# Patient Record
Sex: Male | Born: 1945 | Race: White | Hispanic: No | Marital: Married | Smoking: Former smoker
Health system: Southern US, Community
[De-identification: ages and names within clinical notes are randomized; demographics above are authoritative.]

## PROBLEM LIST (undated history)

## (undated) DIAGNOSIS — E039 Hypothyroidism, unspecified: Secondary | ICD-10-CM

## (undated) DIAGNOSIS — I480 Paroxysmal atrial fibrillation: Principal | ICD-10-CM

## (undated) DIAGNOSIS — Z951 Presence of aortocoronary bypass graft: Secondary | ICD-10-CM

## (undated) DIAGNOSIS — Z7901 Long term (current) use of anticoagulants: Secondary | ICD-10-CM

## (undated) DIAGNOSIS — I739 Peripheral vascular disease, unspecified: Secondary | ICD-10-CM

## (undated) DIAGNOSIS — I10 Essential (primary) hypertension: Secondary | ICD-10-CM

## (undated) DIAGNOSIS — I2510 Atherosclerotic heart disease of native coronary artery without angina pectoris: Secondary | ICD-10-CM

## (undated) DIAGNOSIS — E669 Obesity, unspecified: Secondary | ICD-10-CM

## (undated) DIAGNOSIS — I472 Ventricular tachycardia: Secondary | ICD-10-CM

## (undated) DIAGNOSIS — Z794 Long term (current) use of insulin: Secondary | ICD-10-CM

## (undated) DIAGNOSIS — G4730 Sleep apnea, unspecified: Secondary | ICD-10-CM

## (undated) DIAGNOSIS — E785 Hyperlipidemia, unspecified: Secondary | ICD-10-CM

## (undated) DIAGNOSIS — IMO0001 Reserved for inherently not codable concepts without codable children: Secondary | ICD-10-CM

## (undated) DIAGNOSIS — E119 Type 2 diabetes mellitus without complications: Secondary | ICD-10-CM

## (undated) HISTORY — DX: Peripheral vascular disease, unspecified: I73.9

## (undated) HISTORY — DX: Atherosclerotic heart disease of native coronary artery without angina pectoris: I25.10

## (undated) HISTORY — DX: Obesity, unspecified: E66.9

## (undated) HISTORY — DX: Sleep apnea, unspecified: G47.30

## (undated) HISTORY — DX: Hypothyroidism, unspecified: E03.9

## (undated) HISTORY — DX: Ventricular tachycardia: I47.2

## (undated) HISTORY — DX: Long term (current) use of anticoagulants: Z79.01

## (undated) HISTORY — DX: Paroxysmal atrial fibrillation: I48.0

## (undated) HISTORY — DX: Hyperlipidemia, unspecified: E78.5

## (undated) HISTORY — DX: Essential (primary) hypertension: I10

## (undated) HISTORY — DX: Long term (current) use of insulin: Z79.4

## (undated) HISTORY — DX: Presence of aortocoronary bypass graft: Z95.1

## (undated) HISTORY — DX: Type 2 diabetes mellitus without complications: E11.9

## (undated) HISTORY — DX: Reserved for inherently not codable concepts without codable children: IMO0001

---

## 1999-05-23 ENCOUNTER — Encounter: Admission: RE | Admit: 1999-05-23 | Discharge: 1999-08-21 | Payer: Self-pay | Admitting: Internal Medicine

## 2003-12-01 ENCOUNTER — Encounter: Admission: RE | Admit: 2003-12-01 | Discharge: 2003-12-01 | Payer: Self-pay | Admitting: Allergy and Immunology

## 2003-12-26 ENCOUNTER — Encounter: Admission: RE | Admit: 2003-12-26 | Discharge: 2003-12-26 | Payer: Self-pay | Admitting: Allergy and Immunology

## 2004-01-10 ENCOUNTER — Ambulatory Visit: Payer: Self-pay | Admitting: Oncology

## 2004-02-05 DIAGNOSIS — Z951 Presence of aortocoronary bypass graft: Secondary | ICD-10-CM

## 2004-02-05 DIAGNOSIS — I2510 Atherosclerotic heart disease of native coronary artery without angina pectoris: Secondary | ICD-10-CM

## 2004-02-05 HISTORY — DX: Presence of aortocoronary bypass graft: Z95.1

## 2004-02-05 HISTORY — DX: Atherosclerotic heart disease of native coronary artery without angina pectoris: I25.10

## 2004-02-14 ENCOUNTER — Ambulatory Visit: Payer: Self-pay

## 2004-03-05 ENCOUNTER — Ambulatory Visit (HOSPITAL_COMMUNITY): Admission: RE | Admit: 2004-03-05 | Discharge: 2004-03-05 | Payer: Self-pay | Admitting: Cardiovascular Disease

## 2004-03-05 HISTORY — PX: CARDIAC CATHETERIZATION: SHX172

## 2004-03-14 ENCOUNTER — Encounter (INDEPENDENT_AMBULATORY_CARE_PROVIDER_SITE_OTHER): Payer: Self-pay | Admitting: *Deleted

## 2004-03-14 ENCOUNTER — Inpatient Hospital Stay (HOSPITAL_COMMUNITY)
Admission: RE | Admit: 2004-03-14 | Discharge: 2004-03-19 | Payer: Self-pay | Admitting: Thoracic Surgery (Cardiothoracic Vascular Surgery)

## 2004-03-14 HISTORY — PX: CORONARY ARTERY BYPASS GRAFT: SHX141

## 2004-05-01 ENCOUNTER — Encounter
Admission: RE | Admit: 2004-05-01 | Discharge: 2004-05-01 | Payer: Self-pay | Admitting: Thoracic Surgery (Cardiothoracic Vascular Surgery)

## 2004-05-15 ENCOUNTER — Encounter
Admission: RE | Admit: 2004-05-15 | Discharge: 2004-05-15 | Payer: Self-pay | Admitting: Thoracic Surgery (Cardiothoracic Vascular Surgery)

## 2004-05-16 ENCOUNTER — Ambulatory Visit: Payer: Self-pay | Admitting: Oncology

## 2004-06-04 ENCOUNTER — Ambulatory Visit (HOSPITAL_COMMUNITY): Admission: RE | Admit: 2004-06-04 | Discharge: 2004-06-05 | Payer: Self-pay | Admitting: Cardiovascular Disease

## 2004-06-04 DIAGNOSIS — I739 Peripheral vascular disease, unspecified: Secondary | ICD-10-CM

## 2004-06-04 HISTORY — DX: Peripheral vascular disease, unspecified: I73.9

## 2004-06-04 HISTORY — PX: RENAL ARTERY STENT: SHX2321

## 2004-09-19 ENCOUNTER — Ambulatory Visit: Payer: Self-pay | Admitting: Oncology

## 2004-09-25 ENCOUNTER — Encounter
Admission: RE | Admit: 2004-09-25 | Discharge: 2004-09-25 | Payer: Self-pay | Admitting: Thoracic Surgery (Cardiothoracic Vascular Surgery)

## 2005-01-18 ENCOUNTER — Ambulatory Visit (HOSPITAL_COMMUNITY): Admission: RE | Admit: 2005-01-18 | Discharge: 2005-01-18 | Payer: Self-pay | Admitting: Urology

## 2005-02-05 ENCOUNTER — Encounter
Admission: RE | Admit: 2005-02-05 | Discharge: 2005-02-05 | Payer: Self-pay | Admitting: Thoracic Surgery (Cardiothoracic Vascular Surgery)

## 2005-02-25 ENCOUNTER — Encounter (INDEPENDENT_AMBULATORY_CARE_PROVIDER_SITE_OTHER): Payer: Self-pay | Admitting: *Deleted

## 2005-02-25 ENCOUNTER — Inpatient Hospital Stay (HOSPITAL_COMMUNITY)
Admission: RE | Admit: 2005-02-25 | Discharge: 2005-02-27 | Payer: Self-pay | Admitting: Thoracic Surgery (Cardiothoracic Vascular Surgery)

## 2005-03-21 ENCOUNTER — Encounter
Admission: RE | Admit: 2005-03-21 | Discharge: 2005-03-21 | Payer: Self-pay | Admitting: Thoracic Surgery (Cardiothoracic Vascular Surgery)

## 2005-11-19 ENCOUNTER — Encounter: Admission: RE | Admit: 2005-11-19 | Discharge: 2005-11-19 | Payer: Self-pay | Admitting: Cardiology

## 2006-12-11 IMAGING — CR DG CHEST 2V SAME DAY
2 series · 2 of 2 positions shown · non-contrast
Comparison: 2 view chest x-ray earlier today 5175 hours.

CLINICAL DATA: Left thoracentesis.

CHEST - 2 VIEW  [DATE]/6993 6226 hours:

[view not recorded (1 of 2)]
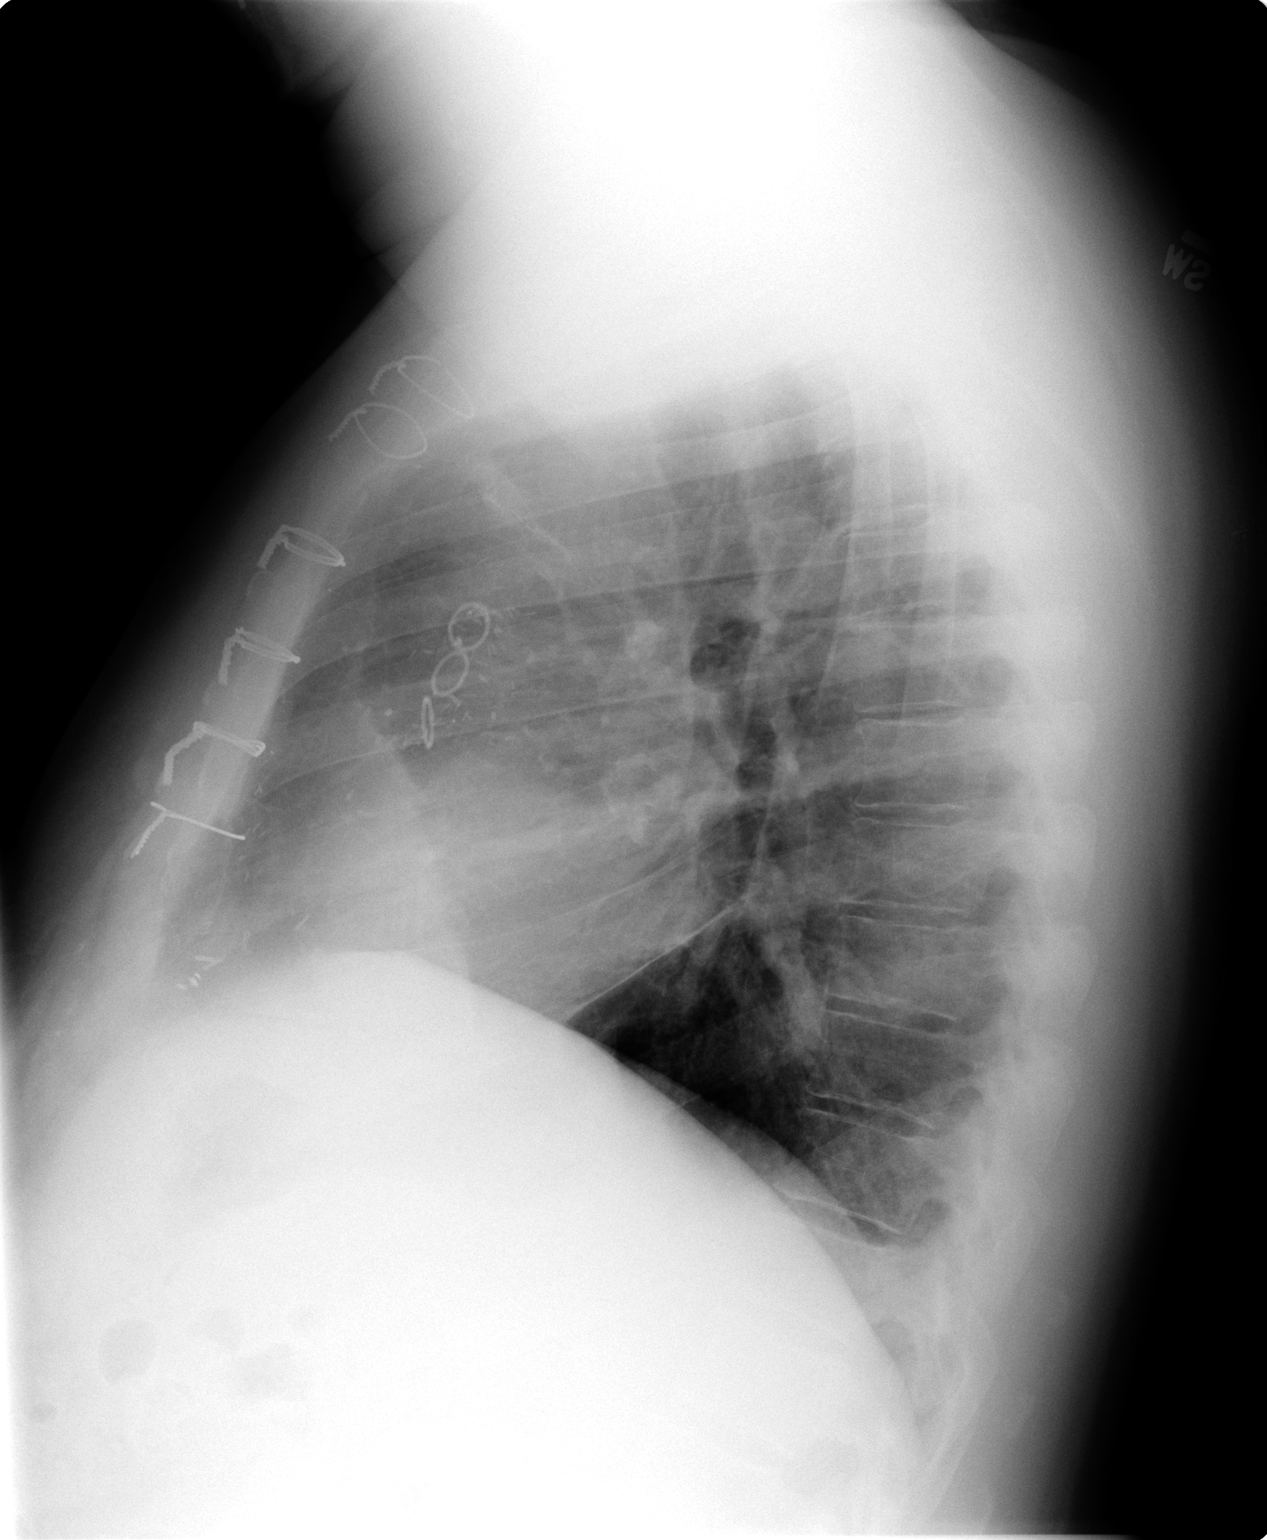

[view not recorded (2 of 2)]
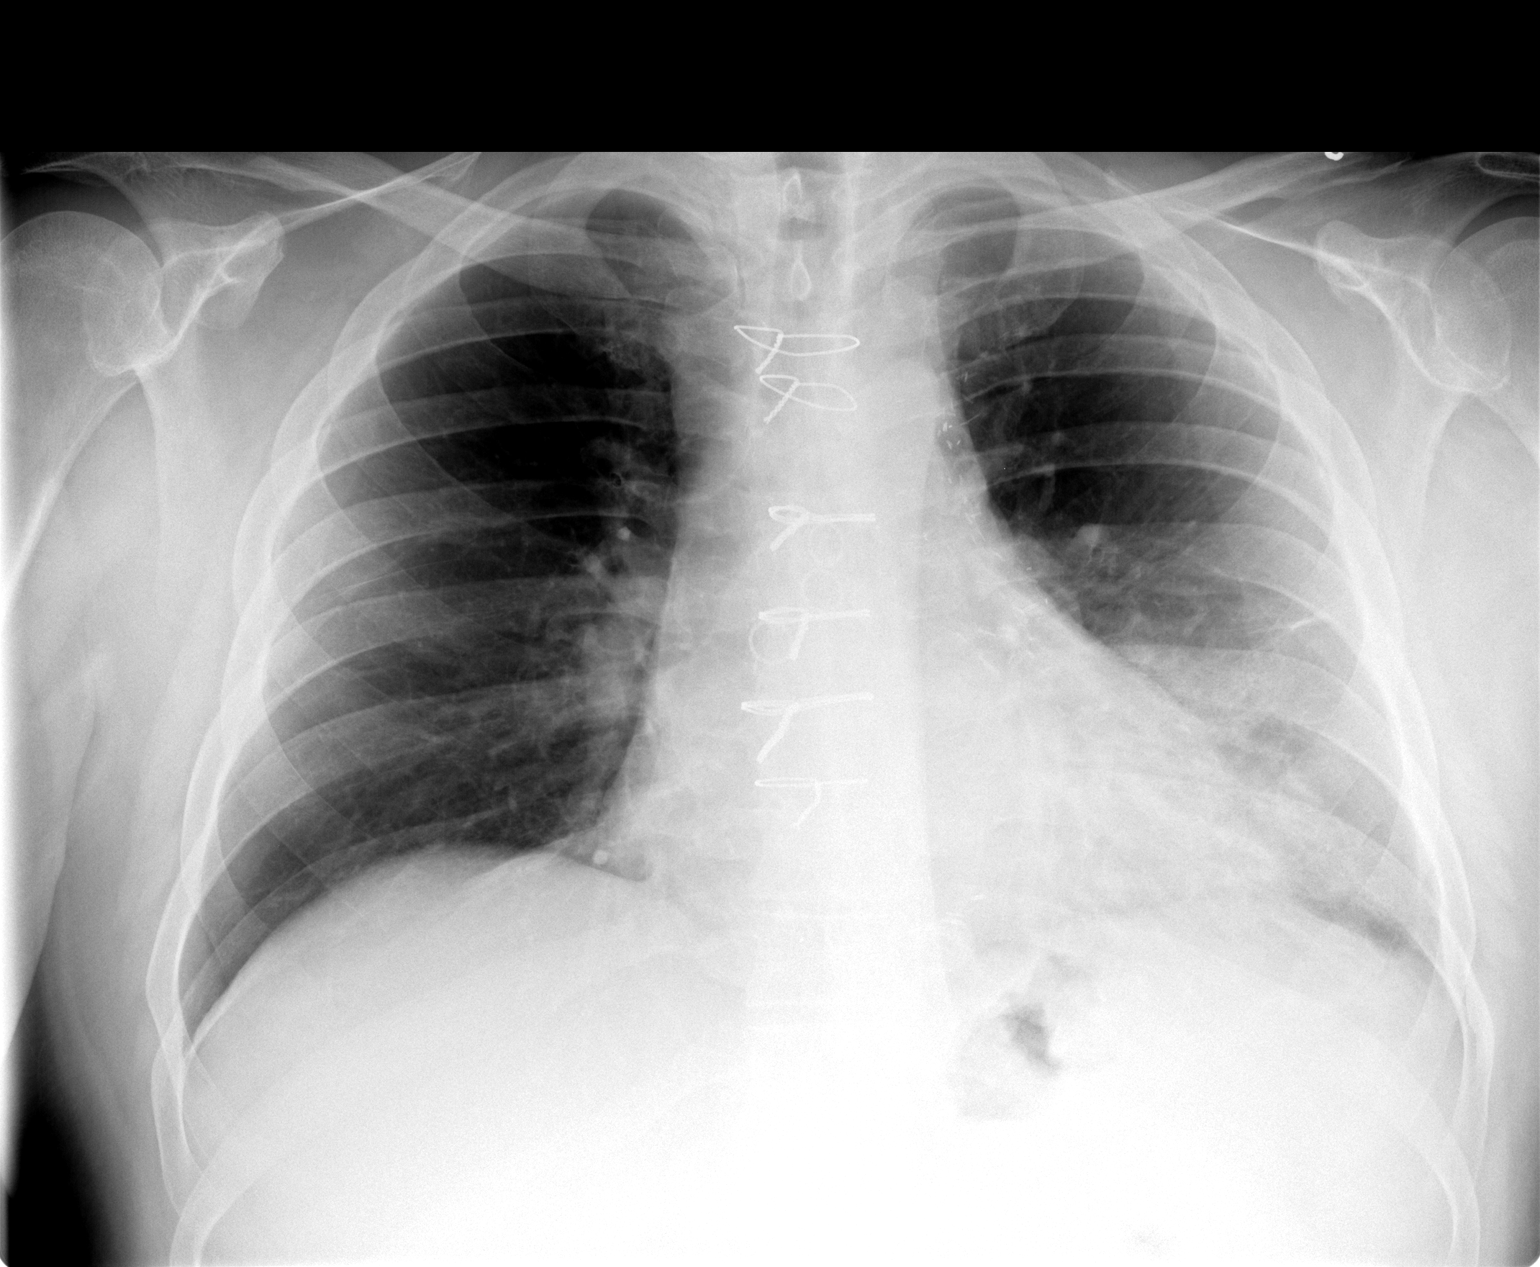

[2 of 2 positions shown; findings below may reference images not displayed]

FINDINGS: In general, patient has undergone left thoracentesis. The left
pleural effusion has resolved. There is persistent atelectasis in the left lower
lobe, though aeration is much improved. There is no pneumothorax. The right lung
remains clear.
IMPRESSION: No pneumothorax after left thoracentesis. Complete resolution of left pleural
effusion. Improved aeration in the left lower lobe with persistent mild to
moderate atelectasis.

## 2006-12-11 IMAGING — CR DG CHEST 2V
2 series · 2 of 2 positions shown · non-contrast
Comparison: 2 view chest x-ray 03/18/2004.

CLINICAL DATA: CABG in March 2004. Routine followup.

CHEST - 2 VIEW  05/01/2004:

[view not recorded (1 of 2)]
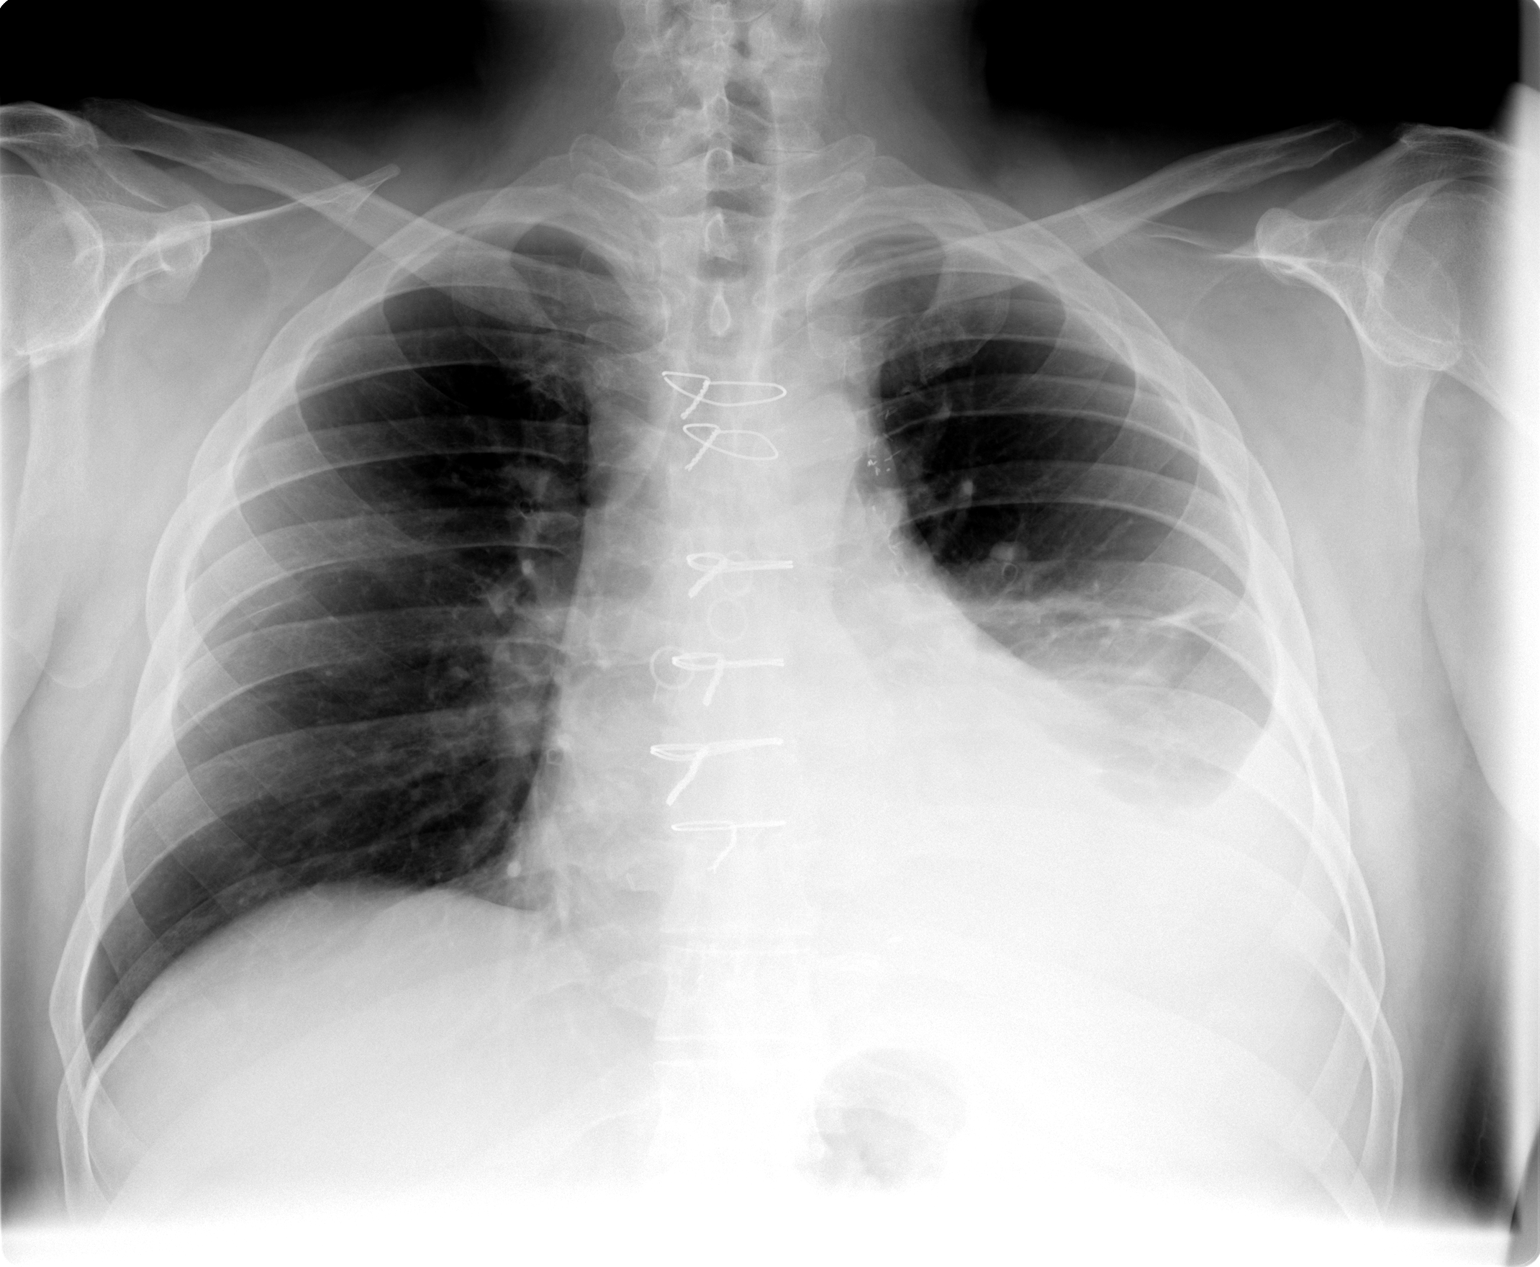

[view not recorded (2 of 2)]
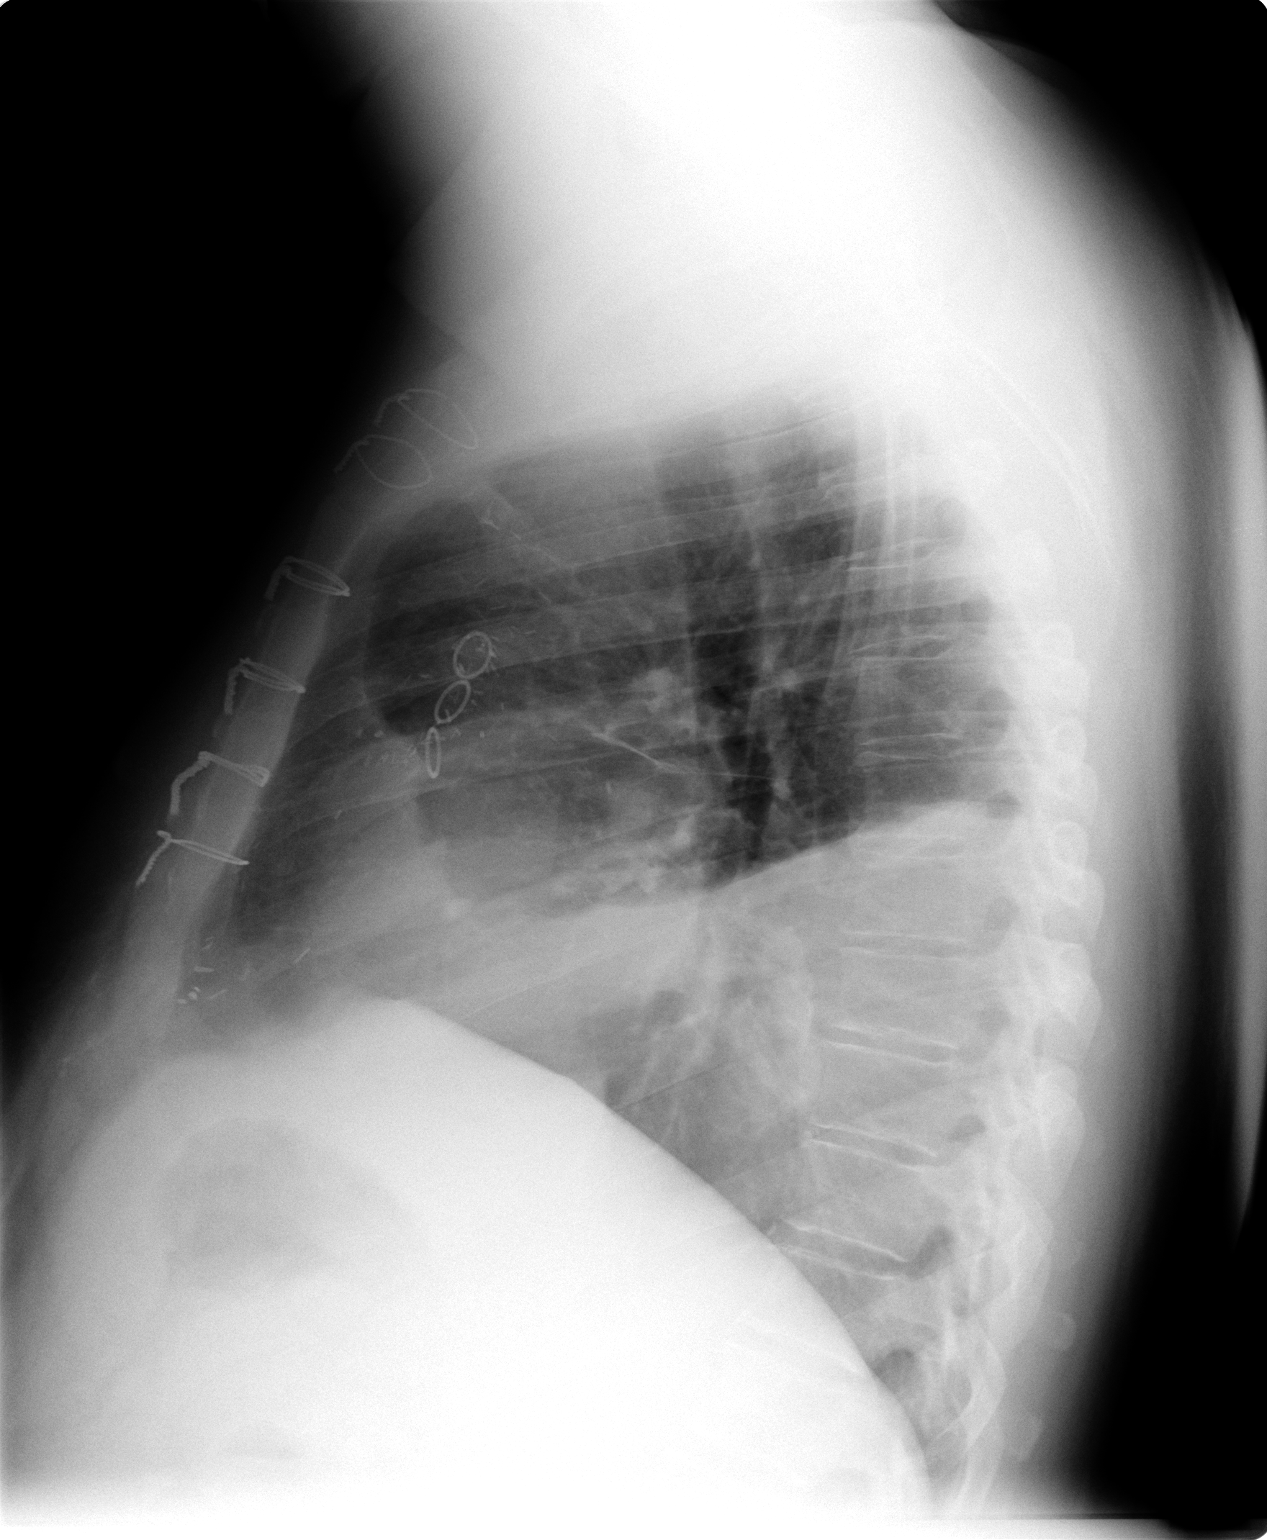

[2 of 2 positions shown; findings below may reference images not displayed]

FINDINGS: Since that time, the patient has developed a large left pleural
effusion with associated dense passive atelectasis in the left lower lobe. The
lungs appear clear otherwise. There is no evidence of pulmonary edema. The heart
is enlarged but stable. Degenerative changes in the midthoracic spine are again
noted. Patient has undergone previous sternotomy for CABG.
IMPRESSION: Development of a large left pleural effusion with associated dense passive
atelectasis in the left lower lobe since 03/18/2004.

## 2009-05-25 ENCOUNTER — Encounter
Admission: RE | Admit: 2009-05-25 | Discharge: 2009-05-25 | Payer: Self-pay | Admitting: Thoracic Surgery (Cardiothoracic Vascular Surgery)

## 2009-05-25 ENCOUNTER — Ambulatory Visit: Payer: Self-pay | Admitting: Thoracic Surgery (Cardiothoracic Vascular Surgery)

## 2009-10-28 ENCOUNTER — Ambulatory Visit (HOSPITAL_BASED_OUTPATIENT_CLINIC_OR_DEPARTMENT_OTHER): Admission: RE | Admit: 2009-10-28 | Discharge: 2009-10-28 | Payer: Self-pay | Admitting: Cardiovascular Disease

## 2009-10-28 ENCOUNTER — Ambulatory Visit: Payer: Self-pay | Admitting: Diagnostic Radiology

## 2010-02-25 ENCOUNTER — Encounter: Payer: Self-pay | Admitting: Cardiovascular Disease

## 2010-06-19 NOTE — Assessment & Plan Note (Signed)
OFFICE VISIT   John Conley, John Conley  DOB:  1945-05-06                                        May 25, 2009  CHART #:  [id number]   REASON FOR OFFICE VISIT:  Sternal pain and clicking.   HISTORY OF PRESENT ILLNESS:  The patient is a 65 year old gentleman who  is well known to me.  He had coronary bypass grafting x5 in February  2006.  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  In January 2007, he underwent  a sternal debridement and rewiring.  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  There was a  single transverse fracture on the left.  A single wire was passed around  that, and then the manubrium was closed with single wires and the body  of the sternum was closed with sternal bands.  He was last seen about a  month after that, at which time he was doing much better.  He was having  minimal discomfort and that continued he says until about 2 months ago  when he noticed when he would bend over in certain fashion or if you  would lift a heavy object that he had pain in his chest.  He said this  did not feel like angina.  It was a sharp pain localized.  It was very  transient.  He really does not even have to take an aspirin for it.  He  also felt some clicking and some movement in the lower part of the  sternum but not the upper part of the sternum.  He now comes in for  evaluation.   CURRENT MEDICATIONS:  1. Actos 45 mg q.a.m.  2. Aspirin 162 mg q.p.m.  3. Coenzyme Q10.  4. Furosemide 40 mg daily.  5. Glimepiride 1 mg daily.  6. Lipitor 40 mg daily.  7. Losartan 100 mg daily.  8. Metformin 850 mg t.i.d.  9. Potassium 20 mg daily.  10.Flomax 0.4 mg at bedtime.  11.TriCor 145 mg daily.  12.He has sublingual nitroglycerin but has not had to take him.   ALLERGIES:  He has no known drug allergies.   PAST MEDICAL HISTORY:  Significant for coronary artery disease status  post coronary bypass grafting, sternal  dehiscence and sternal rewiring,  type 2 diabetes, hypercholesterolemia, hypertension, sleep apnea,  previous shoulder surgery, knee surgery, previous penile implant.   REVIEW OF SYSTEMS:  He has been feeling well other than the current  complaints, very active, no anginal-type symptoms, no heart failure  symptoms.  All other systems are negative.   PHYSICAL EXAMINATION:  The patient is an overweight but otherwise well-  appearing white male in no acute distress.  Blood pressure is 129/66,  pulse 70, respirations are 18, oxygen saturation is 98% on room air.  His sternal incision is clean, dry, and intact.  The skin is well  healed.  There are no signs of infection.  The upper sternum is stable.  The lower sternum on the left side on the lower third there is palpable  clicking and popping around the costal cartilages and some mild  tenderness to palpation.   DIAGNOSTIC TESTS:  Chest x-ray is reviewed and shows a change in  position of the sternal bands relative to the transverse wire.  It is  really difficult to tell on the plain films that the fracture of the  bands which may well represent or whether there is a very subtle  fracture of the wire, this allowed the bands to pull across.   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  Wound intact with no  signs of infection.   I had a discussion with the patient regarding our options which are  strictly based on symptoms.  There is no medical necessity to repair the  sternum per say.  Right now, his symptoms are minimal.  I did discuss  with him our options which include rewiring.  I do not think that would  be advisable in this situation given that he has failed that twice.  Potentially, we could plate the lower portion of his sternum or some  combination of plates and wires might be effective, but he is always  going to have some significant risk of failure  given that he has failed  twice on the lower portion of the sternum.  He at the present time does  not want to consider surgery.  He is very comfortable that he is not in  medical danger and we will try and balance his activities.  Certainly if he were to begin to have more discomfort or pain, we could  at anytime decide to go ahead and work on the sternum again.   Salvatore Decent Dorris Fetch, M.D.  Electronically Signed   SCH/MEDQ  D:  05/25/2009  T:  05/26/2009  Job:  [id number]   cc:   Macarthur Critchley. Shelva Majestic, M.D.  Nanetta Batty, M.D.  Sid Falcon, MD

## 2010-06-22 NOTE — Discharge Summary (Signed)
John Conley, John Conley          ACCOUNT NO.:  [id number]   MEDICAL RECORD NO.:  [id number]          PATIENT TYPE:  INP   LOCATION:  2018                         FACILITY:  [facility]   PHYSICIAN:  Salvatore Decent. Dorris Fetch, M.D.DATE OF BIRTH:  12/27/1945   DATE OF ADMISSION:  03/14/2004  DATE OF DISCHARGE:  03/18/2004                                 DISCHARGE SUMMARY   ADMISSION DIAGNOSIS:  Three vessel coronary artery disease.   DISCHARGE DIAGNOSES:  1.  Hypertension.  2.  Hyperlipidemia.  3.  Type 2 noninsulin-dependent diabetes mellitus.  4.  Sleep apnea which for he uses a CPAP.  5.  Three vessel coronary artery disease, status post coronary artery bypass      grafting x 5 with endarterectomy.  6.  Postoperative anemia, stable.  7.  Postoperative creatinine elevation, stable.   ALLERGIES:  No known drug allergies.   BRIEF HISTORY:  The patient is a 65 year old Caucasian male with a  longstanding history of type 2 diabetes mellitus, hypertension, and  hyperlipidemia. For approximately three to four months prior to admission,  the patient noticed pain in his neck on the right side with exercise. This  happened several times when he was excited; however, he notices no symptoms  at rest. He then saw Dr. Macarthur Critchley. Torelli in [facility] where he had an  exercise echocardiogram performed. This did not show any evidence of  ischemia; however, he only achieved 73% of his predicted heart rate. He was  treated medically; however, he continued to have symptoms and was  subsequently sent to have a Persantine-Cardiolite study done which showed  left lateral wall ischemia. The patient was then referred to Dr. Nanetta Batty for cardiac catheterization. This revealed severe three vessel  coronary artery disease with preserved left ventricular function. He was  then sent for consultation regarding surgical revascularization by Dr.  Viviann Spare C. Hendrickson.   Dr. Salvatore Decent. Dorris Fetch evaluated  the patient on March 07, 2004 at the  CVTS office and it was his opinion that the patient should undergo coronary  artery bypass grafting.   HOSPITAL COURSE:  The patient was admitted and taken to the OR on March 14, 2004 for coronary artery bypass graft surgery x 5. An endarterectomy was  also performed of the first diagonal. The left internal mammary artery was  grafted to the LAD, saphenous vein was grafted to the distal left anterior  descending artery, saphenous vein was grafted to the first diagonal,  saphenous vein was grafted to the posterior descending and left radial  artery was grafted to the ramus intermediate. Endoscopic vessel harvesting  was performed on the left leg and the radial artery was harvested in an open  fashion. The patient tolerated the procedure well and was hemodynamically  stable immediately postoperatively. The patient was extubated without  complication and awoke up from anesthesia neurologically intact.  The  patient was transferred from the OR to the SICU in stable condition.   On postoperative day one, the patient was afebrile with stable vital signs  and he was maintaining normal sinus rhythm. Physical exam was unremarkable  and the patient was doing well. Invasive lines and chest tubes were  discontinued in a routine manner and he tolerated this well. The patient was  restarted on his Amaryl and Actos at that time. The patient has been quite  volume overloaded postoperatively and has been diuresed accordingly. He will  need to continue diuresis after discharge.   Dr. Veverly Fells. Altheimer has been following the patient postoperatively for  his diabetes mellitus. He has been maintained on his Actos and Amaryl as  well as sliding scale Novolog insulin. The patient's Metformin was not  restarted secondary to an elevated creatinine. His creatinine level was 1.7  on postoperative day three and four. This has remained stable and he will  therefore not  be restarted on his Metformin at this time; however, it should  possibly be started as an outpatient.   The patient began cardiac rehabilitation on postoperative day two and has  increased his tolerance and has is doing quite well with that at this time.   The remainder of the patient's postoperative course has progressed as  expected with the exception of a mild postoperative anemia which was noted  on postoperative day four. His hemoglobin and hematocrit are 8.8 and 25.1 at  this time. A repeat CBC will be performed prior to the patient's discharge.   On postoperative day four, the patient is without complaint. He is afebrile  with stable vital signs and he is maintaining normal sinus rhythm. On  physical exam, cardiac is regular rate and rhythm. Lung exam reveals  decreased breath sounds in the bilateral bases and faint crackles in the  bilateral bases. The abdomen is benign. The sternal and bilateral lower  extremity incisions are clean, dry, and intact and there is a small amount  of serous drainage present at the distal portion of the incision on the left  upper extremity. There is 1+ pitting edema present in the bilateral lower  extremities as well as left upper extremity. Neurologic and motor exams are  normal on the left hand.   The patient is in stable condition at this time. He will need to be  continued on his diuretic after discharge for his volume overload as he is  still approximately 20 pounds above his preoperative weight. His creatinine  is stable at 1.7 at this time and therefore his Metformin will continue to  be held. Considering the fact the patient will continue on a diuretic, this  will need to be monitored closely as an outpatient. 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 The patient's ACE inhibitor has therefore been held secondary to the renal artery  stenosis and  his elevated creatinine.   As long as the patient continues to progress in the current manner, he  should be stable for discharge within the next one to two days.   LABORATORY DATA:  CBC and BNP on March 18, 2004 showed white count of  7.5, hemoglobin 8.8, hematocrit 25.1, and platelets 186,000. Sodium 136,  potassium 4.6, BUN 46, creatinine 1.7, glucose 96.   CONDITION ON DISCHARGE:  Improved.   DISCHARGE MEDICATIONS:  1.  Aspirin 325 mg q.d.  2.  Toprol XL 25 mg q.d.  3.  Lipitor 40 mg q.d.  4.  Tri-Chlor 145 mg q.d.  5.  Actos 45 mg q.d.  6.  Amaryl 2 mg b.i.d.  7.  Diltiazem 240 mg q.d.  8.  Imdur 30 mg q.d. x 6 weeks.  9.  Plavix 75 mg q.d.  10. Lasix 40 mg q.d.  11. K-Dur 20 mEq q.d.  12. Tylox 1-2 p.o. q.4-6h. p.r.n. pain.   ACTIVITY:  No driving and no lifting more than 10 pounds. The patient should  continue daily breathing and walking exercises.   DIET:  Low salt, low fat, carbohydrate modified medium calorie.   WOUND CARE:  The patient may shower daily and clean the incisions with soap  and water. If any problems arise, the patient should contact the CVTS  office.   FOLLOW UP:  Follow-up appointment with Dr. Macarthur Critchley. Torelli. The patient  should call his office for an appointment two weeks after discharge at [phone]. The patient should have a chest x-ray taken at the time of that  appointment. Dr. Salvatore Decent. Hendrickson in three weeks after discharge. CVTS  office will contact the patient with the date and time of that appointment.  The patient has been instructed to bring the chest x-ray from the  appointment with Dr. Macarthur Critchley. Torelli to the appointment with Dr. Viviann Spare C.  Hendrickson. The patient should also follow up with his primary care  physician regarding his diabetes mellitus.      AY/MEDQ  D:  03/18/2004  T:  03/18/2004  Job:  [id number]   cc:   Salvatore Decent. Dorris Fetch, M.D.  [address]  [place]  [place] [address]   Macarthur Critchley.  Shelva Majestic, M.D.  [address] [place]  [address]  [place]  [place] [address]  Fax: [phone]   Patient's chart

## 2010-06-22 NOTE — Op Note (Signed)
John Conley, John Conley          ACCOUNT NO.:  [id number]   MEDICAL RECORD NO.:  [id number]          PATIENT TYPE:  INP   LOCATION:  2007                         FACILITY:  [facility]   PHYSICIAN:  Salvatore Decent. Dorris Fetch, M.D.DATE OF BIRTH:  1945-02-05   DATE OF PROCEDURE:  02/25/2005  DATE OF DISCHARGE:                                 OPERATIVE REPORT   PREOPERATIVE DIAGNOSIS:  Sternal nonunion.   POSTOPERATIVE DIAGNOSIS:  Sternal nonunion.   PROCEDURE:  Sternal debridement and rewiring.   SURGEON:  Hendrickson.   ANESTHESIA:  General.   FINDINGS:  Sternal nonunion with pseudo joint formation, no significant  osteoporosis, single transverse fracture in left hemi sternum.   CLINICAL NOTE:  John Conley is a 65 year old gentleman who underwent  coronary bypass grafting on March 14, 2004.  He has done well from a  cardiac standpoint, but developed chronic sternal pain and motion in the  sternum during the recuperative period.  He now presents with progressively  increasing discomfort and limitation and interference with his quality of  life. The patient is offered sternal debridement and rewiring.  He  understood the risks which include repeat sternal nonunion, infection,  bleeding, DVT, PE, injury to heart or other intrathoracic structures, MI or  death. He understood and accepted these risks and agreed to proceed.   OPERATIVE NOTE:  John Conley was brought to the preop holding area on  February 25, 2005.  Lines were placed by the anesthesia service for  intravenous access. Intravenous antibiotics were administered. He was taken  to the operating room, anesthetized and intubated.  The chest and abdomen  were prepped and draped in usual fashion. Incision was made through the  patient's prior midline sternotomy incision, excising the scar. The  subcutaneous tissue was divided with electrocautery exposing the sternal  wires. The preexisting sternal wires were removed without  difficulty. There  was a clear sternal nonunion with a pseudo joint formation. This fibrous  tissue was incised along the anterior table initially and the posterior  aspect of the fibrous capsule was divided with scissors.  Each hemi sternum  then was gently elevated with bone hooks to allow debridement of the fibrous  tissue.  On both sides, the tissue was debrided back to vascularized bone.  Rongeurs were used to debride the cortical bone an curette was used to  debride the marrow.  The right pleural space was entered while working on  the posterior aspect of the right hemi sternum. There was no evidence of  infection. After completely debriding the sternum back to healthy viable  bone tissue, the transverse fracture was repaired on the left side with two  encircling sternal wires and a single wire was passed twice around the  interspaces above and below to bring this portion of the sternum back  together.  The manubrial portion of sternum then was closed with heavy gauge  double stainless steel wires and sternal bands then were placed around the  body of the sternum through the interspaces.  A total of two double wires  and four sternal bands were used.  The sternum came back together  easily in  the sternal halves were well-approximated. The wound was copiously irrigated  with warm antibiotic saline.  The fascial layer was closed with a running #1  Vicryl suture.  The subcutaneous tissue was closed with running 2-0 Vicryl suture and the  skin was closed with 3-0 Vicryl subcuticular suture. All sponge, needle and  instrument counts were correct at the end of the procedure. The patient was  taken from the operating room to the postanesthetic care unit extubated and  in stable condition.           ______________________________  Salvatore Decent Dorris Fetch, M.D.     SCH/MEDQ  D:  02/26/2005  T:  02/27/2005  Job:  [id number]   cc:   Macarthur Critchley. Shelva Majestic, M.D.  Fax: [phone]

## 2010-06-22 NOTE — Discharge Summary (Signed)
John Conley, John Conley          ACCOUNT NO.:  [id number]   MEDICAL RECORD NO.:  [id number]          PATIENT TYPE:  INP   LOCATION:  2007                         FACILITY:  [facility]   PHYSICIAN:  Salvatore Decent. Dorris Fetch, M.D.DATE OF BIRTH:  Aug 08, 1945   DATE OF ADMISSION:  02/25/2005  DATE OF DISCHARGE:  02/26/2005                                 DISCHARGE SUMMARY   ADMISSION DIAGNOSIS:  Sternal dehiscence, status post coronary artery bypass  graft in February 2006.   PAST MEDICAL HISTORY AND DISCHARGE DIAGNOSES:  1.  Coronary artery disease.  2.  Status post coronary artery bypass graft in 2006.  3.  Diabetes mellitus, type 2.  4.  Hypercholesterolemia.  5.  Hypertension.  6.  History of sleep apnea.  7.  Status post shoulder surgery.  8.  Status post knee surgery.  9.  Status post penile implant in December 2006.  10. Sternal dehiscence, status post sternal re-wiring with debridement.   ALLERGIES:  No known drug allergies.   BRIEF HISTORY:  The patient is a 65 year old Caucasian male who is status  post coronary artery bypass grafting times five in February 2006 by Dr.  Dorris Fetch. The patient did well initially postoperatively and was  discharged home on postoperative day four. He later developed sternal click  and evidence of dehiscence. However, he remained stable for quite some time  with limited symptoms related to this. More recently the patient developed  increasing discomfort associated with this and his symptoms were primarily  noted with coughing , standing and any type of lifting. He was recently re-  evaluated by Dr. Dorris Fetch who planned to proceed with sternal re-wiring.   HOSPITAL COURSE:  The patient was admitted and taken to the OR on February 25, 2005, for sternal debridement and re-wiring. The patient tolerated the  procedure well and was hemodynamically immediately postoperatively. The  patient was transferred from the OR to the postanesthesia care unit  in  stable condition. The patient was extubated without complication, woke from  anesthesia neurologically intact.   The patient's postoperative course progressed as expected. He ambulated on  the evening of his operative day and is doing quite well with this. On  postoperative day one he was without complaint. He was afebrile with stable  vital signs. The patient's CBGs are being controlled with his home oral  medications. On physical exam, the wound is clean, dry, and intact, the  lungs are clear, and his chest x-ray is stable. All invasive lines and chest  tubes have been discontinued on a routine manner and the patient tolerated  this well. As long as the patient continues to progress in the current  manner he will be ready for discharge in the next one or two days pending  morning round re-evaluation.   LABORATORY DATA:  CBC on February 21, 2005, revealed hemoglobin of 12.1,  hematocrit 34.5, white count 5.3, platelet count 195,000. BMP on January  18th revealed sodium of 134, potassium 4.4, BUN 24, creatinine 1.3, glucose  202.   CONDITION ON DISCHARGE:  Stable.   INSTRUCTIONS:  1.  Diet--ADA.  2.  Activity--no  driving for three weeks, no lifting for three weeks, and      the patient should increase his activity slowly and continue his daily      breathing and walking exercises.  3.  Wound care--the patient should clean the site daily with soap and water.      If wound problems arise the CVTS office should be contacted.   MEDICATIONS:  1.  Tylox one to two q.4-6h. p.r.n. pain.  2.  Actos 4-5 mg daily.  3.  Glyburide 2 mg q.a.m. and 4 mg q.p.m.  4.  Cartia 240 mg q.a.m.  5.  Cozaar 20 mg at midday.  6.  Metformin 850 mg b.i.d.  7.  Tricor 145 mg q.a.m.  8.  Lipitor 40 mg q.h.s.  9.  Aspirin 325 mg daily.   FOLLOWUP APPOINTMENT:  1.   Imaging for a PA and lateral chest x-ray on March 21, 2005, at 10:45 a.m.  2.  Dr. Dorris Fetch on March 21, 2005, at  11:45 a.m.      Pecola Leisure, PA    ______________________________  Salvatore Decent Dorris Fetch, M.D.    AY/MEDQ  D:  02/26/2005  T:  02/26/2005  Job:  [id number]

## 2010-06-22 NOTE — Discharge Summary (Signed)
NAMELAWERNCE, EARLL          ACCOUNT NO.:  [id number]   MEDICAL RECORD NO.:  [id number]          PATIENT TYPE:  OIB   LOCATION:  6531                         FACILITY:  [facility]   PHYSICIAN:  Lezlie Octave, N.P.     DATE OF BIRTH:  August 27, 1945   DATE OF ADMISSION:  06/04/2004  DATE OF DISCHARGE:  06/05/2004                                 DISCHARGE SUMMARY   HISTORY OF PRESENT ILLNESS:  Mr. Pap Zoe Lan is a 65 year old white married  male patient of Dr. Harland Dingwall who recently underwent coronary artery  bypass grafting March 24, 2004, at his cardiac catheterization.  It was  also noted that he had renal artery stenosis.  He returns on Jun 04, 2004,  for [facility] angio/renal angio and probable PCI.  He underwent angiogram by Dr.  Nanetta Batty on Jun 04, 2004, which revealed a 90% left renal artery  stenosis.  He underwent stenting with a 6x18 Genesis.  Postprocedure, he did  well on Jun 05, 2004.  He was seen by Dr. Nanetta Batty.  He was considered  stable for discharge home.  His labs showed his BUN was 18, creatinine was  1.2, sodium 139, potassium 3.9, chloride 101, CO2 29.  Hemoglobin 11.5,  hematocrit 32.5, platelets 196,000, WBC 5.1.  No medication changes were  made.  His monitor showed that he was in sinus rhythm.   DISCHARGE MEDICATIONS:  1.  Cartia 240 mg daily.  2.  Tri-Chlor 145 mg daily.  3.  Actos 45 mg once daily.  4.  Lipitor 20 mg once daily.  5.  Amaryl 2 mg b.i.d.  6.  Aspirin 325 mg once daily.  7.  Multivitamin once daily.  8.  Hyzaar 100/25 one half daily.   DISCHARGE INSTRUCTIONS:  He may return to cardiac rehab on Monday if his  groin is without any significant bruising or discomfort.  If he has any  problems with his groin, he has been asked to call Dr. Shelva Majestic.  He will  follow up with renal Doppler at Dr. Clayborne Dana office at [facility] on Jun 25, 2004, at 8 a.m.  Our office will send  him instructions about the  test and our location.  After that, he has a  follow up appointment with Dr. Billie Ruddy in approximately 2-3 weeks.   DISCHARGE DIAGNOSES:  1.  ASPVD with left renal artery stenosis, high grade with history of renal      artery insufficiency.  He is now status post stenting of his left renal      artery.  2.  Coronary artery disease with coronary artery bypass graft on March 24, 2004, by Dr. Dorris Fetch with LIMA to his left anterior descending.      Left radial artery to the RI, saphenous vein graft to the distal left      anterior descending, saphenous vein graft to the first diagonal and then      saphenous vein graft to the PDA.  3.  Hypertension.  4.  Non-insulin-dependent diabetes mellitus.  5.  Sleep apnea on  CPAP.  6.  Hyperlipidemia.  7.  Normal left ventricular function.  8.  History of renal insufficiency.      BB/MEDQ  D:  06/05/2004  T:  06/05/2004  Job:  [id number]   cc:   Macarthur Critchley. Shelva Majestic, M.D.  [address] [place]  [address]  [place]  [place] [address]  Fax: [phone]   Reather Littler, M.D.  [address], [address]  [place]  [place] [address]  Fax: [phone]   Salvatore Decent. Dorris Fetch, M.D.  [address]  [place]  [place] [address]

## 2010-06-22 NOTE — Op Note (Signed)
John Conley, John Conley          ACCOUNT NO.:  [id number]   MEDICAL RECORD NO.:  [id number]          PATIENT TYPE:  INP   LOCATION:  2316                         FACILITY:  [facility]   PHYSICIAN:  Salvatore Decent. Dorris Fetch, M.D.DATE OF BIRTH:  03-23-1945   DATE OF PROCEDURE:  03/14/2004  DATE OF DISCHARGE:                                 OPERATIVE REPORT   PREOPERATIVE DIAGNOSIS:  Severe three-vessel disease with exertional angina.   POSTOPERATIVE DIAGNOSIS:  Severe three-vessel disease with exertional  angina.   PROCEDURE:  Median sternotomy; extracorporeal circulation; coronary artery  bypass grafting x5 with endarterectomy (left internal mammary artery to mid  left anterior descending, saphenous vein graft to the distal left anterior  descending, saphenous vein graft to first diagonal with an endarterectomy,  saphenous vein graft to the posterior descending, left radial artery to  ramus intermedius); endoscopic vein harvest, left leg.   SURGEON:  Salvatore Decent. Dorris Fetch, MD   ASSISTANT:  Carmin Muskrat. Eustaquio Boyden.   ANESTHESIA:  General.   FINDINGS:  Diffusely diseased coronaries, mid LAD -- a fair target, distal  LAD -- poor target, diagonal -- poor target, requiring endarterectomy,  posterior descending and ramus intermedius -- good targets, good-quality  conduits, saphenous vein not localized in right leg, no vein harvested from  that side, saphenous vein from the left leg of good quality, left  ventricular hypertrophy.   CLINICAL NOTE:  John Conley is a 65 year old gentleman with multiple  cardiac risk factors.  He presents with a history of exertional discomfort.  He was evaluated first by Dr. Macarthur Critchley. Torelli and then referred to Dr.  Nanetta Batty for catheterization.  At cardiac catheterization, he had  severe three-vessel disease; he was referred for coronary artery bypass  grafting.  The indications, risks, benefits and alternatives were discussed  in detail with the  patient.  He understood that there was a significant  chance of an incomplete revascularization due to the severity of his native  disease.  John Conley understood and accepted the risks of surgery and  agreed to proceed.   OPERATIVE NOTE:  John Conley was brought to the preop holding area on  March 14, 2004.  There, the anesthesia service placed lines to monitor  arterial, central venous and pulmonary arterial pressure.  EKG leads were  placed for continuous telemetry.  Intravenous antibiotics were administered.  He was transported to the operating room, anesthetized and intubated, a  Foley catheter was placed, and the chest, abdomen, legs and left arm were  prepped and draped in usual fashion.   An incision was made over the radial pulse on the volar aspect of the left  forearm; this incision was approximately 6- to 8-cm long initially.  It was  carried through the skin and subcutaneous tissue.  Minimal electrocautery  was utilized.  The radial fascia was identified and incised.  The underlying  radial pulse was palpated.  The patient had had a normal Allen's test and  normal pulse oximetry tracing with radial occlusion preoperatively.  A short  segment of the radial artery then was immobilized; branches were clipped and  divided.  After circumferentially immobilizing the radial artery, a soft  bulldog clamp was placed across the vessel and there was a good pulse  distally.  The bulldog clamp was removed and the radial then was harvested  in standard fashion.  An incision was made from just below the antecubital  fossa in a lazy S fashion and carried down to the wrist incision.  The  harmonic scalpel was used to divide the subcutaneous tissue and then to  harvest the radial graft.  The radial was a good-quality vessel.  The  patient was heparinized prior to dividing the distal end of the radial  graft.  After dividing the distal end, there was excellent flow through the   radial.  Intraluminal papaverine was instilled and then a soft bulldog clamp  was placed across the distal end of the graft.  The distal stump was then  inspected; there was good back-bleeding; it was ligated with a 4-0 Prolene  suture.  The radial artery then was ligated proximally after ensuring that  all branches had been clipped and placed in a heparinized papaverine/saline  solution.  The proximal stump was likewise ligated with a 4-0 Prolene  suture.  The incision then was closed in 2 layers with a running 3-0 Vicryl  subcutaneous suture and a running 3-0 Vicryl subcuticular suture after  achieving meticulous hemostasis.  The arm then was wrapped and then tucked  at the patient's side.  Simultaneously, then an incision was made in the  medial aspect of the right leg at the level of the knee.  Exploration failed  to reveal a saphenous vein at that site; therefore an incision was made on  the left leg and the saphenous vein was easily identified and was a good-  quality vessel.  It was harvested from the left thigh endoscopically and a  short segment was harvested from below the  knee endoscopically as well.  This saphenous vein was of good quality.   A median sternotomy was performed and the left internal mammary artery was  harvested in standard fashion; this was difficult due to the patient's body  habitus, but the mammary artery was a good-quality vessel.  The remainder of  the full heparin dose was given prior to dividing the distal end of the left  mammary artery; there was excellent flow through the cut end of the vessel.   The pericardium was opened and the ascending aorta was inspected and  palpated; there was no palpable atherosclerotic disease.  The aorta was  cannulated via concentric 2-0 Ethibond pledgeted pursestring sutures.  A  dual-stage venous cannula was placed via pursestring suture in the right atrial appendage.  Cardiopulmonary bypass was institute and the patient  was  cooled to 32 degrees Celsius.  The coronary arteries were inspected and  anastomotic sites were chosen.  The conduits were inspected and cut to  length.  A foam pad was placed in the pericardium to protect the left  phrenic nerve.  A temperature probe was placed in the myocardial septum and  a cardioplegia cannula was placed in the ascending aorta.   The aorta was cross-clamped.  The left ventricle was emptied via the aortic  root vent and then cardiac arrest then was achieved with a combination cold  antegrade blood cardioplegia and topical iced saline.  After achieving a  complete diastolic arrest and myocardial septal cooling to 8 degrees  Celsius, the following distal anastomoses were performed:   First, a reversed saphenous vein graft  was placed end-to-side to the  posterior descending branch of the right coronary; this was a 1.5-mm good-  quality target.  The vein graft was of good quality.  The anastomosis was  performed with a running 7-0 Prolene suture in an end-to-side fashion.  This  anastomosis was probed proximally and distally, as were all subsequent  anastomoses prior to tying the suture.  There was excellent flow through the  graft.  Cardioplegia was administered; there was good hemostasis.   Next, a reversed saphenous vein graft was placed end-to-side to the distal  LAD.  The patient had a total occlusion of his LAD in the mid-portion; the  distal portion had only shown very minimally at catheterization.  It was a  graftable vessel, but was of poor quality.  It quickly tapered  to a 1-mm  vessel beyond the anastomosis and was diffusely diseased.  The vein graft  was anastomosed end-to-side with a running 7-0 Prolene suture.  There was  good flow through the graft.  Cardioplegia was administered; there was a  leak from the heel of the anastomosis which required 7-0 Prolene suture.   Next, a reversed saphenous vein graft was placed end-to-side to the first  diagonal  branch of the left circumflex; this vessel was more diffusely  diseased than was apparent by catheterization; it had a tight proximal  lesion, then there was additional plaquing further down it.  Arteriotomy was  performed and there was extensive plaquing in the area of the anastomosis  which required a limited endarterectomy.  The plaque removed smoothly with  gastric outlet obstruction feathering at the distal end.  The vein graft  then was anastomosed to the endarterectomized vessel with a running 7-0  Prolene suture.  Cardioplegia was administered; there was good flow through  the graft.  A small leak near the toe was repaired with a 7-0 Prolene  suture; there was then good hemostasis.   Next, the distal end of the left radial artery was spatulated, the  accompanying veins were clipped; it then was anastomosed end-to-side to the  ramus intermedius branch; this was a 1.5-mm vessel; it was of good quality at the site of the anastomosis; it was intramyocardial beyond the  anastomosis; it had a tight lesion proximally.  The anastomosis was  performed with a running 8-0 Prolene suture.  Again, this anastomosis was  probed.  The graft flushed easily.  Cardioplegia was administered; there was  good hemostasis.   Next, the left internal mammary artery was brought through a window in the  pericardium.  The distal end was spatulated; it then was anastomosed end-to-  side to the mid LAD proximal to the total occlusion.  This segment of the  LAD was of good quality at the site of anastomosis, but there was a total  occlusion shortly distal.  The probe did pass proximally without resistance  for a fair segment before encountering plaque.  The mammary was a good-  quality vessel; it was anastomosis end-to-side with a running 8-0 Prolene  suture.  At completion of the mammary-to-LAD anastomosis, bulldog clamps  were briefly removed to inspect for hemostasis; immediate and rapid septal  rewarming  was noted.  The bulldog clamp was replaced.  The mammary pedicle  was tacked to the epicardial surface of the heart with 6-0 Prolene sutures.   Additional cardioplegia was administered.  The vein grafts were cut to  length.  The proximal vein graft anastomoses then were performed to 4.5-mm  punch aortotomies with running 6-0 Prolene suture; this was done while under  crossclamp.  At the completion of the final proximal vein graft anastomosis,  the patient was placed in Trendelenburg position.  Before tying the  anastomosis, de-airing was performed, lidocaine was administered, the aortic  cross-clamp was removed; the total cross-clamp time was 96 minutes.  The  patient was initially in heart block, but then resumed a spontaneous rhythm  and did not require defibrillation.   The left radial graft then was cut to length.  Bulldog clamps were placed  proximally and distally on the vein graft to the diagonal.  A longitudinal  venotomy as performed and the radial proximal anastomosis was performed end-  to-side with the vein with a running 7-0 Prolene suture.  The anastomosis  was deaired, the bulldog clamps were removed.  All proximal and distal  anastomoses were inspected for hemostasis while the patient was being  rewarmed.  Epicardial pacing wires were placed on the right ventricle and  right atrium.  When the patient had been rewarmed to a core temperature of  37 degree Celsius, the DDD pacing was initiated and he was weaned from  cardiopulmonary bypass without difficulty. Total bypass time was 173  minutes.  The initial careful inspection was low; however, he responded  appropriately to volume administration.  His hemodynamics improved and he  remained hemodynamically stable throughout the post-bypass period.   A test dose of protamine was administered and was well-tolerated.  The  atrial and aortic cannulae were removed.  The remainder of the protamine was administered without incident.   The chest was irrigated with 1 L of warm  normal saline containing 1 g of vancomycin.  Hemostasis was achieved.  A  left pleural and 2 mediastinal chest tubes were placed through separate  subcostal incisions.  The pericardium was not reapproximated.  The  mediastinal fat was reapproximated to cover the vein grafts and prevent  adhesions of the innominata or aorta to the sternum. The sternum then was  closed with a combination of single and double heavy-gauge stainless steel  wires.  The pectoralis fascia was closed with a running #1 Vicryl suture,  the subcutaneous tissue was closed with a running 2-0 Vicryl suture and the  skin was closed with a subcuticular suture.  The sponge and instrument  counts were correct. There  was a missing needle from the 7-0 Prolene suture; a chest x-ray will be done  in the surgical intensive care unit.  John Conley then was transported  from the operating room to the surgical intensive care unit, intubated and  in critical but stable condition.      SCH/MEDQ  D:  03/14/2004  T:  03/15/2004  Job:  [id number]   cc:   Macarthur Critchley. Shelva Majestic, M.D.  [address]  [address]  [place]  [place] [address]  Fax: [phone]   Nanetta Batty, M.D.  Fax: [phone]

## 2010-06-22 NOTE — Cardiovascular Report (Signed)
[name], [name]:  [id number]   MEDICAL RECORD NO.:  [id number]          PATIENT TYPE:  OIB   LOCATION:  2866                         FACILITY:  [facility]   PHYSICIAN:  Nanetta Batty, M.D.   DATE OF BIRTH:  11/01/1945   DATE OF PROCEDURE:  03/05/2004  DATE OF DISCHARGE:                              CARDIAC CATHETERIZATION   [name] is a 65 year old white male with a history of type 2 diabetes  and exertional angina over the last several months.  He had a negative  stress echocardiogram with a positive Cardiolite performed recently which  showed lateral ischemia, as well as some apical ischemia.  The patient  presents now for diagnostic coronary arteriography.   PROCEDURE DESCRIPTION:  The patient was brought to the second floor [facility] in the post absorptive state.  He  was premedicated with p.o. Valium.  His right groin was prepped and shaved  in the usual sterile fashion.  One percent Xylocaine was used for local  anesthesia.  A 6 French sheath was inserted into the right femoral artery  using standard Seldinger technique.  Then 6 French right and left diagnostic  catheters, as well as a 6 French pigtail catheter, were used for selective  coronary angiography, left ventriculography and distal abdominal  aortography.  Subselective left internal mammary artery angiography was also  performed.  Visipaque dye was used for the entirety of the case.  Retrograde  aortic, left ventricular and pullback pressures were recorded.   HEMODYNAMICS:  1.  Aortic systolic pressure 186 and diastolic pressure 70.  2.  Left ventricular systolic pressure 193 and diastolic pressure 24.   SELECTIVE ANGIOGRAPHY:  1.  Left main normal.  2.  Left anterior descending:  The LAD had a long 80% stenosis in its mid      portion just after the takeoff of the first medium size diagonal branch      which had a 90% ostial stenosis.  The LAD  was then totally occluded in      its distal third and filled by __________ collaterals.  3.  Ramus intermedius branch:  This was a moderate size vessel with 80%      ostial stenosis followed by short segment total occlusion in its      proximal portion which filled by left-to-left collaterals.  4.  Left circumflex:  This is a nondominant vessel with a focal 80% stenosis      in the AV groove distally.  5.  Right coronary artery:  This is a large dominant vessel with 60%      proximal and 90% mid stenosis.  There was 70% stenosis at the crux of      the vessel which extended into the proximal PDA.  The proximal PLA had      90% stenosis and the distal PLA had 90% branching bifurcation disease.  6.  Left ventriculography:  RAO left ventriculogram was performed using 25      mL of Visipaque dye at 12 mL/sec.  The overall LVEF was estimated at      greater  than 50% without focal wall motion abnormalities.  7.  Left internal mammary artery:  This vessel was subselectively visualized      and was widely patent.  It was suitable for use during coronary artery      bypass grafting.  8.  Distal abdominal aortography:  Distal abdominal aortogram was performed      using 30 mL of Visipaque dye at 20 mL/sec.  There was a 90% proximal      left renal artery stenosis.  The infrarenal abdominal aorta and iliac      bifurcation appear free of significant atherosclerotic changes.   IMPRESSION:  [name] has significant three-vessel disease with normal  left ventricular function and a positive Cardiolite with exertional angina.  He will need complete revascularization by bypass grafting.  He also has an  incidentally noted 90% left renal artery stenosis which may be responsible  for his hypertension.  Nevertheless, it will need to be fixed percutaneously  at some point after his heart is revascularized.   The sheaths were removed and pressure was held on the groin to achieve  hemostasis.  The  patient left the laboratory in stable condition.  He will  be discharged home later today to be seen as an outpatient.  He will see Dr.  Shelva Majestic back in followup.  Dr. Shelva Majestic has been notified of these results.      JB/MEDQ  D:  03/05/2004  T:  03/05/2004  Job:  [id number]   cc:   Second Floor Cardiac Catheterization Lab   [facility]  [address]  [place], [place] [address]   Macarthur Critchley. Shelva Majestic, M.D.  [address] [place]  [address]  [place]  [place] [address]  Fax: [phone]   [facility]  [place], [place]

## 2010-06-22 NOTE — H&P (Signed)
NAME:  John Conley, John Conley          ACCOUNT NO.:  [id number]   MEDICAL RECORD NO.:  [id number]          PATIENT TYPE:  INP   LOCATION:  NA                           FACILITY:  [facility]   PHYSICIAN:  Salvatore Decent. Dorris Fetch, M.D.DATE OF BIRTH:  1945/05/06   DATE OF ADMISSION:  02/25/2005  DATE OF DISCHARGE:                                HISTORY & PHYSICAL   CHIEF COMPLAINT:  Sternal dehiscence, status post coronary artery bypass  grafting in February of 2006.   HISTORY OF PRESENT ILLNESS:  The patient is a 65 year old Caucasian male who  is status post coronary artery bypass grafting x5 on March 14, 2004 by Dr.  Viviann Spare C. Hendrickson.  The patient did quite well initially postoperatively  and was discharged on postoperative day #4.  He later developed a sternal  click and evidence of dehiscence; however, he remained quite stable for some  time with limited symptoms related to this.  More recently, he has developed  increasing discomfort associated with this, and symptoms are primarily with  coughing, sneezing, and any type of lifting.  He has been reevaluated by Dr.  Dorris Fetch, and he has planned to proceed with sternal rewiring.  The  patient denies any recent angina, shortness of breath, or dyspnea on  exertion.  He denies paroxysmal nocturnal dyspnea or orthopnea.  He does  have some lower extremity edema, occasional cough with occasional sputum  production.  He denies fever or chills.  He is to be admitted February 25, 2005 for the procedure.   PAST MEDICAL HISTORY:  1.  Coronary artery disease.  2.  Diabetes mellitus type 2.  3.  Hypercholesterolemia.  4.  Hypertension.  5.  History of sleep apnea.  Previously, he used a CPAP machine, but states      he currently does not use that.   PAST SURGICAL HISTORY:  1.  Shoulder surgery.  2.  Knee surgery.  3.  Coronary artery bypass grafting, as described.  4.  He has also had a penile implant in December of 2006.   NOTE:  Per  the patient, Dr. Logan Bores, his urologist, states that if a catheter  is needed for this procedure, it should be no larger than 12-French Foley,  and, if possible, he would like to use the Foley for no longer than 24  hours.   ALLERGIES:  No known drug allergies.   CURRENT MEDICATIONS:  1.  Actos 45 mg daily.  2.  Glyburide 2 mg in the a.m., 4 mg in the p.m.  3.  Cartia 240 mg q.a.m.  4.  Cozaar 20 mg q noon.  5.  Metformin 850 mg b.i.d.  6.  Tricor 145 mg q.a.m.  7.  Lipitor 40 mg q.h.s.  8.  Aspirin 325 mg daily.  9.  He was recently also placed on Lasix, although he is not aware of the      current dose, and has been on Lasix intermittently since October.   REVIEW OF SYMPTOMS:  See the history of present illness for pertinent  positives and negatives.  Otherwise, unremarkable.   SOCIAL HISTORY:  He  is married.  He is a previous smoker.  He quite 30 years  ago, but did smoke two packs per day for approximately 10-15 years.  Also  use is moderate.   FAMILY HISTORY:  Father deceased from cancer.  Also notable for multiple  family members on his father's side having diabetes.   PHYSICAL EXAMINATION:  VITAL SIGNS:  Blood pressure 140/80, heart rate 74,  respirations 20.  GENERAL:  This is a 65 year old moderately obese white male in no acute  distress.  HEENT:  Normocephalic and atraumatic.  Pupils equal, round and reactive to  light.  Extraocular movements intact.  Oral mucosa is pink and moist.  There  are no pharyngeal exudates or erythema.  Sclerae are nonicteric.  NECK:  Supple.  No jugular venous distention.  No carotid bruits.  No  lymphadenopathy.  No thyromegaly.  PULMONARY:  Symmetrical on inspiration, unlabored, with clear breath sounds.  No wheezes, rhonchi or crackles.  CARDIAC:  Regular rate and rhythm.  No murmurs, gallops, or rubs.  Normal S1  and S2.  There is sternal mobility on palpation of the chest.  There is a  sternal click.  ABDOMEN:  Soft, nontender,  nondistended, obese.  Normoactive bowel sounds.  No masses, no bruits, no hepatosplenomegaly.  GENITOURINARY/RECTAL:  Deferred.  EXTREMITIES:  Mild pitting edema bilaterally.  No clubbing, cyanosis, or  edema.  Peripheral pulses reveal palpable dorsalis pedis bilaterally.  NEUROLOGIC:  Nonfocal.  Alert and oriented x4.  Gait is steady.  Muscle  strength is 5+ and equal bilaterally.  No asymmetrical findings.   ASSESSMENT:  Sternal dehiscence.   PLAN:  Sternal rewire.      Rowe Clack, P.A.-C.    ______________________________  Salvatore Decent Dorris Fetch, M.D.    Sherryll Burger  D:  02/21/2005  T:  02/21/2005  Job:  [id number]   cc:   Reather Littler, M.D.  Fax: [phone]   Bland Span(?), M.D.   Nanetta Batty, M.D.  Fax: [phone]

## 2010-06-22 NOTE — H&P (Signed)
[name], [name] NO.:  [id number]   MEDICAL RECORD NO.:  [id number]          PATIENT TYPE:  OIB   LOCATION:  2899                         FACILITY:  [facility]   PHYSICIAN:  Nanetta Batty, M.D.   DATE OF BIRTH:  May 09, 1945   DATE OF ADMISSION:  06/04/2004  DATE OF DISCHARGE:                                HISTORY & PHYSICAL   HISTORY OF PRESENT ILLNESS:  [name] is a 65 year old male followed  by Dr. Harland Dingwall. He was evaluated in January for exertional jaw pain.  He does have risk factors for coronary disease including hypertension,  diabetes, and dyslipidemia. A Cardiolite study done at [facility]  was abnormal. He was referred to Dr. Allyson Sabal March 05, 2004 for diagnostic  catheterization. This was done as an inpatient at [facility]. The patient had  three-vessel disease with good LV function. There was also noted to be a 90%  left renal artery stenosis. The patient underwent bypass surgery electively  March 24, 2004 by Dr. Dorris Fetch with an LIMA to the LAD, left radial  artery to the ramus intermedius, SVG to the distal LAD, SVG to the first  diagonal, and an SVG to the PDA. Postoperatively, he did well. The distal  LAD was noted to be a poor vessel. He had a creatinine of 1.5 on admission,  it went to 1.7 postoperatively, but otherwise his hospital course was  unremarkable. The patient was discharged March 19, 2004. He is now  admitted electively for renal artery intervention. In the interim, he has  done well. He denies any palpitations, tachycardia, or unusual shortness of  breath. He has seen Dr. Shelva Majestic back in follow-up.   PAST MEDICAL HISTORY:  Remarkable for hypertension, dyslipidemia, and  diabetes followed by Dr. Lucianne Muss. He has a history of sleep apnea and is on  CPAP but does not use it on a regular basis. He has had intermittent lower  extremity edema in the past. He has renal artery stenosis and coronary  artery disease as  noted above.   CURRENT MEDICATIONS:  1.  Cartia 240 mg a day.  2.  Tricor 145 mg a day.  3.  Actos 45 mg a day.  4.  Lipitor 40 mg h.s.  5.  Amaryl 2 mg b.i.d.  6.  Hyzaar 100/25 one-half tablet a day.  7.  Aspirin 325 mg a day.  8.  Multivitamin daily.   He has no known drug allergies.   SOCIAL HISTORY:  He is married with two children. He works at [employer]. He  smoked about two packs a day for about 10 years and quit over 20 years ago.   FAMILY HISTORY:  Unremarkable for coronary disease.   REVIEW OF SYSTEMS:  Essentially unremarkable except for noted above. His  dyspnea on exertion is improved. He is enrolled in cardiac rehab currently.   PHYSICAL EXAMINATION:  VITAL SIGNS:  Blood pressure 140/75, pulse 68,  temperature 97.5.  GENERAL:  He is a well-developed, well-nourished male in no acute distress.  HEENT:  Normocephalic. He does wear glasses.  NECK:  Without JVD and without bruit.  CHEST:  Clear to auscultation and percussion.  CARDIOVASCULAR:  Reveals regular rate and rhythm with a normal S1 and S2. No  obvious murmur, rub, or gallop.  ABDOMEN:  Nontender. No hepatosplenomegaly, no bruits.  EXTREMITIES:  Without edema. There are no femoral bruits noted. Distal  pulses are 3+/4.  NEUROLOGIC:  Grossly intact. He is awake, alert, oriented, and cooperative  and moves all extremities without obvious deficit.   His EKG showed sinus rhythm with small inferior Q's.   IMPRESSION:  1.  Renal artery stenosis.  2.  Hypertension.  3.  Renal insufficiency with his last creatinine being 1.7 in February 2006.  4.  Coronary disease status post coronary artery bypass grafting February      2006.  5.  Preserved left ventricular function.  6.  Non-insulin-dependent diabetes.  7.  Dyslipidemia, followed by Dr. Lucianne Muss.  8.  Obesity and sleep apnea with intermittent CPAP use.   PLAN:  The patient is admitted now for elective renal artery angioplasty.  Labs are ordered.       LKK/MEDQ  D:  06/04/2004  T:  06/04/2004  Job:  [id number]

## 2010-06-22 NOTE — Cardiovascular Report (Signed)
NAMEETHANAEL, John Conley NO.:  [id number]   MEDICAL RECORD NO.:  [id number]          PATIENT TYPE:  OIB   LOCATION:  2899                         FACILITY:  [facility]   PHYSICIAN:  Nanetta Batty, M.D.   DATE OF BIRTH:  1945/04/05   DATE OF PROCEDURE:  06/04/2004  DATE OF DISCHARGE:                              CARDIAC CATHETERIZATION   [name] is a 65 year old married white male, patient of Dr. Elyn Aquas  with coronary artery disease and peripheral vascular obstructive disease.  I  performed diagnostic coronary arteriography on him Jun 04, 2004 revealing  three-vessel disease with preserved left ventricular function.  He was also  noted to have a 90% left renal artery stenosis at that time.  He underwent  coronary artery bypass grafting in the interim and is recuperating nicely.  He presents now for left renal artery percutaneous transluminal  arteriography and stenting for renal preservation and treatment of renal  vascular hypertension.   PROCEDURE DESCRIPTION:  The patient was brought to the 6th Floor [facility] in the post absorptive state.  He was  premedicated with p.o. Valium.  His right groin was prepped and shaved in  the usual sterile fashion.  1% Xylocaine was used for local anesthesia.  A 6  French sheath was inserted into the right femoral artery using standard  Seldinger technique.  A 5 French __________ catheter was used for midstream  abdominal aortography.  A 6 French renal double curve short guide was used  along with an ON4 190 stabilizer wire and a 4 x 2 Aviator for predilatation.  Visipaque dye was used for the entirety of the case.  Retrograde aortic  pressures monitored during the case.  The patient received 2500 units of  heparin intravenously prior to intervention.   The abdominal aortography was performed.  Following this selective left  renal artery angiography was performed with the double renal  curve guide.  The wire crossed the lesion easily and the 4-2 aviator was used to dilate  the lesion.  Following this a 6 x 18 Genesis on Aviator balloon stent premet  was then deployed at 14 atmospheres and post dilated with a 715 Aviator at 6  atmospheres resulting in reduction of 90% segmental proximal right renal  artery stenosis to 0% residual without complications, dissections or  sequelae.  The patient tolerated the procedure well.  The guide wire and  catheter were removed.  The right femoral arterial puncture site was  hemostatically sealed using  the Star hemostasis device with excellent result.  The patient left the lab  in stable condition.  He will be hydrated overnight and discharged home in  the morning, after which he will follow-up with Dr. Harland Dingwall.  He will  need followup renal Doppler studies in our office as a baseline.  Dr.  Shelva Majestic was notified of these results.      JB/MEDQ  D:  06/04/2004  T:  06/04/2004  Job:  [id number]   cc:   [facility]  [address] [place]  [place] [address]   Lisbeth Ply  Caren Macadam, M.D.  [address] [place]  [address]  [place]  [place] [address]  Fax: [phone]

## 2010-06-22 NOTE — Op Note (Signed)
NAMEJORAM, VENSON          ACCOUNT NO.:  [id number]   MEDICAL RECORD NO.:  [id number]          PATIENT TYPE:  AMB   LOCATION:  DAY                          FACILITY:  [facility]   PHYSICIAN:  Jamison Neighbor, M.D.  DATE OF BIRTH:  1945/10/05   DATE OF PROCEDURE:  01/18/2005  DATE OF DISCHARGE:                                 OPERATIVE REPORT   PREOPERATIVE DIAGNOSIS:  Organic erectile dysfunction.   POSTOPERATIVE DIAGNOSIS:  Organic erectile dysfunction.   PROCEDURE:  Placement of inflatable penile prosthesis.   SURGEON:  Jamison Neighbor, MD.   ASSISTANT:  Glade Nurse, MD.   ANESTHESIA:  General endotracheal.   DRAINS:  None.   IMPLANT:  AMS 700 CX 15 cm pump with bilateral 3 cm rear tip and a 65 mL  reservoir.   DESCRIPTION OF PROCEDURE:  The patient was identified by his wrist bracelet  and brought to room 12 where he received preprocedure antibiotics and was  administered general anesthesia. He was prepped and draped in the usual  sterile fashion including a 15-minute skin prep. Next we placed an 18-French  Foley catheter at the level of the urinary bladder, clear urine output was  obtained, Foley catheter was clamped. An 8-cm transverse infrapubic incision  was made and we dissected down through the dermis, subcutaneous fat,  Scarpa's fascia into a space where we could bluntly dissect over the  corporal bodies. We were careful to avoid sharp or Bovie dissection around  the midline neurovascular bundle. We identified the bilateral corporal  bodies and palpated the urethra underlying them. Next we used interrupted 2-  0 Vicryls to place opposing stay sutures on each corpora which were placed  in a rather proximal direction. These were placed at the same level  bilaterally. Throughout this, we palpated the urethra to prevent inadvertent  damage to the corpora spongiosa. Next we made a corporotomy between each  pair of  stay sutures and began our dissection proximally  and distally with  Metzenbaum scissors. We then continued proximally and distally dilating down  to the bone proximally down to the distal tip of the glans distally with  Mentor dilators which we carried out from 8 to 58 [place] sequentially.  Next  we performed a crossover test by placing Mentor dilators down each corpora  simultaneously. There was no crossover noted. Next, we carefully measured  each total corporal length and it was noted to be 17 cm. This was done by  placing the measuring device proximally and providing traction of the penis  and measuring to the mid glans area. This was done on each side and found to  be 5 cm symmetrically. Next we adjusted our retractors to expose the  external ring. With the surgeon's first finger, we placed it through the  external ring and punctured the floor, opened up the space behind the pubis  and the space of Retzius. A 65 mL reservoir was placed into the space. We  placed 65 mL of sterile normal saline in it. After flushing the line with  sterile normal saline, there was no back pressure noted at 65  mLs and we  removed 5 mL and shotted  the tubing. Next we bluntly created a space for  the pump over the right hemiscrotum down in the most dependent portion of  the scrotum. We everted the anterior scrotum of our infrapubic incision and  thinned out this tissue with blunt dissection. Next, using the needle and  the furlough device, we placed each corporal cylinder bringing the needles  out distally and laterally in the glans penis. Next, 3 cm of rear-tip was  placed on each cylinder and we placed the cylinders in the corpora both  proximally and distally. Next, we inflated the pump with approximately 30 mL  of normal saline. Excellent contour was noted. At the corporotomy, the  cylinders were smooth without kinking. Distally, they filled out the corpora  to the glans penis which was symmetric bilaterally. Next, we deflated the  corporal devices  and the corportomies were closed using  preplaced #2 Vicryl  stay sutures. This closed the corporotomy completely and provided excellent  hemostasis. Next, we placed our pump in the aforementioned dependent portion  of the right hemiscrotum. Next, using the connector device, we connected the  tubing from the reservoir to the pump being careful to trim an adequate  amount of tubing. Next, we tested the device, it was noted the cycle several  times. The contour would continue to be excellent as described above. The  cylinders cycled and deflated completely when the deactivation button was  pressed. Next, we copiously irrigated our wound several times and we closed  our wound in 2 layers closing the Scarpa's with a running 3-0 Vicryl being  careful not to involve the tubing in the closure. We copiously irrigated the  superficial wound several times and again closed the skin with a running 4-0  subcuticular Monocryl. We then connected the Foley catheter to drain. We  removed the furlough sutures dividing 1/2 of each suture so we could pull  the entire suture from the corporal cylinder. Next dressings were applied.  The patient was reversed from his anesthesia which he tolerated we  complications. Please note Dr. Marcelyn Bruins was present and participated in  all aspects of this case.     ______________________________  Glade Nurse, MD      Jamison Neighbor, M.D.  Electronically Signed    MT/MEDQ  D:  01/18/2005  T:  01/21/2005  Job:  [id number]

## 2011-10-06 HISTORY — PX: OTHER SURGICAL HISTORY: SHX169

## 2011-10-06 HISTORY — PX: NM MYOCAR PERF WALL MOTION: HXRAD629

## 2012-04-04 DIAGNOSIS — I472 Ventricular tachycardia: Secondary | ICD-10-CM

## 2012-04-04 DIAGNOSIS — I480 Paroxysmal atrial fibrillation: Secondary | ICD-10-CM

## 2012-04-04 HISTORY — DX: Paroxysmal atrial fibrillation: I48.0

## 2012-04-04 HISTORY — PX: TRANSTHORACIC ECHOCARDIOGRAM: SHX275

## 2012-04-04 HISTORY — DX: Ventricular tachycardia: I47.2

## 2012-04-28 ENCOUNTER — Other Ambulatory Visit (HOSPITAL_COMMUNITY): Payer: Self-pay | Admitting: Cardiology

## 2012-04-28 DIAGNOSIS — I4891 Unspecified atrial fibrillation: Secondary | ICD-10-CM

## 2012-05-01 ENCOUNTER — Ambulatory Visit (HOSPITAL_COMMUNITY)
Admission: RE | Admit: 2012-05-01 | Discharge: 2012-05-01 | Disposition: A | Discharge status: Home / Self Care | Payer: Medicare Other | Source: Ambulatory Visit | Attending: Cardiovascular Disease | Admitting: Cardiovascular Disease

## 2012-05-01 DIAGNOSIS — G4730 Sleep apnea, unspecified: Secondary | ICD-10-CM | POA: Insufficient documentation

## 2012-05-01 DIAGNOSIS — E669 Obesity, unspecified: Secondary | ICD-10-CM | POA: Insufficient documentation

## 2012-05-01 DIAGNOSIS — I4891 Unspecified atrial fibrillation: Secondary | ICD-10-CM | POA: Insufficient documentation

## 2012-05-01 DIAGNOSIS — I079 Rheumatic tricuspid valve disease, unspecified: Secondary | ICD-10-CM | POA: Insufficient documentation

## 2012-05-01 DIAGNOSIS — I059 Rheumatic mitral valve disease, unspecified: Secondary | ICD-10-CM | POA: Insufficient documentation

## 2012-05-01 DIAGNOSIS — E785 Hyperlipidemia, unspecified: Secondary | ICD-10-CM | POA: Insufficient documentation

## 2012-05-01 DIAGNOSIS — I2510 Atherosclerotic heart disease of native coronary artery without angina pectoris: Secondary | ICD-10-CM | POA: Insufficient documentation

## 2012-05-01 DIAGNOSIS — E119 Type 2 diabetes mellitus without complications: Secondary | ICD-10-CM | POA: Insufficient documentation

## 2012-05-01 NOTE — Progress Notes (Signed)
[facility]   2D echo completed 05/01/2012.   Cindy Yvonda Fouty, RDCS  

## 2012-06-24 ENCOUNTER — Other Ambulatory Visit: Payer: Self-pay | Admitting: Gastroenterology

## 2012-08-03 ENCOUNTER — Other Ambulatory Visit (HOSPITAL_BASED_OUTPATIENT_CLINIC_OR_DEPARTMENT_OTHER): Payer: Self-pay | Admitting: Cardiovascular Disease

## 2012-08-03 NOTE — Telephone Encounter (Signed)
Rx was printed for JB to sign and will be faxed once JB signs off.

## 2012-08-12 ENCOUNTER — Other Ambulatory Visit: Payer: Self-pay

## 2012-08-12 MED ORDER — METOPROLOL TARTRATE 25 MG PO TABS: 12.5000 mg | ORAL_TABLET | Freq: Two times a day (BID) | ORAL | Status: DC

## 2012-08-12 NOTE — Telephone Encounter (Signed)
Rx was sent to pharmacy electronically. 

## 2012-08-27 ENCOUNTER — Other Ambulatory Visit: Payer: Self-pay | Admitting: Pharmacist Clinician (PhC)/ Clinical Pharmacy Specialist

## 2012-08-27 MED ORDER — APIXABAN 5 MG PO TABS: 5.0000 mg | ORAL_TABLET | Freq: Two times a day (BID) | ORAL | Status: DC

## 2012-10-26 ENCOUNTER — Other Ambulatory Visit: Payer: Self-pay | Admitting: Pharmacist Clinician (PhC)/ Clinical Pharmacy Specialist

## 2012-10-26 MED ORDER — APIXABAN 5 MG PO TABS: 5.0000 mg | ORAL_TABLET | Freq: Two times a day (BID) | ORAL | Status: DC

## 2012-12-02 ENCOUNTER — Ambulatory Visit (INDEPENDENT_AMBULATORY_CARE_PROVIDER_SITE_OTHER): Payer: Medicare Other | Admitting: Cardiology

## 2012-12-02 ENCOUNTER — Encounter: Payer: Self-pay | Admitting: Cardiology

## 2012-12-02 VITALS — BP 120/60 | HR 77 | Ht 68.0 in | Wt 227.0 lb

## 2012-12-02 DIAGNOSIS — Z794 Long term (current) use of insulin: Secondary | ICD-10-CM

## 2012-12-02 DIAGNOSIS — E119 Type 2 diabetes mellitus without complications: Secondary | ICD-10-CM

## 2012-12-02 DIAGNOSIS — E039 Hypothyroidism, unspecified: Secondary | ICD-10-CM | POA: Insufficient documentation

## 2012-12-02 DIAGNOSIS — E669 Obesity, unspecified: Secondary | ICD-10-CM

## 2012-12-02 DIAGNOSIS — I739 Peripheral vascular disease, unspecified: Secondary | ICD-10-CM | POA: Insufficient documentation

## 2012-12-02 DIAGNOSIS — T50905A Adverse effect of unspecified drugs, medicaments and biological substances, initial encounter: Secondary | ICD-10-CM

## 2012-12-02 DIAGNOSIS — I4891 Unspecified atrial fibrillation: Secondary | ICD-10-CM

## 2012-12-02 DIAGNOSIS — I472 Ventricular tachycardia: Secondary | ICD-10-CM | POA: Insufficient documentation

## 2012-12-02 DIAGNOSIS — IMO0001 Reserved for inherently not codable concepts without codable children: Secondary | ICD-10-CM | POA: Insufficient documentation

## 2012-12-02 DIAGNOSIS — T887XXA Unspecified adverse effect of drug or medicament, initial encounter: Secondary | ICD-10-CM

## 2012-12-02 DIAGNOSIS — I480 Paroxysmal atrial fibrillation: Secondary | ICD-10-CM

## 2012-12-02 DIAGNOSIS — E785 Hyperlipidemia, unspecified: Secondary | ICD-10-CM | POA: Insufficient documentation

## 2012-12-02 DIAGNOSIS — I2510 Atherosclerotic heart disease of native coronary artery without angina pectoris: Secondary | ICD-10-CM

## 2012-12-02 DIAGNOSIS — G4730 Sleep apnea, unspecified: Secondary | ICD-10-CM | POA: Insufficient documentation

## 2012-12-02 DIAGNOSIS — Z7901 Long term (current) use of anticoagulants: Secondary | ICD-10-CM

## 2012-12-02 MED ORDER — NEBIVOLOL HCL 2.5 MG PO TABS: 2.5000 mg | ORAL_TABLET | Freq: Every day | ORAL | Status: DC

## 2012-12-02 NOTE — Assessment & Plan Note (Signed)
Followed By Dr Thea Silversmith

## 2012-12-02 NOTE — Assessment & Plan Note (Signed)
No angina 

## 2012-12-02 NOTE — Assessment & Plan Note (Signed)
On Eliquis

## 2012-12-02 NOTE — Assessment & Plan Note (Signed)
NSR today 

## 2012-12-02 NOTE — Assessment & Plan Note (Signed)
BMI 34 

## 2012-12-02 NOTE — Patient Instructions (Signed)
Your physician recommends that you schedule a follow-up appointment in: 6 months with Dr Allyson Sabal Stop Metoprolol, start Bystolic 2.5 mg

## 2012-12-02 NOTE — Assessment & Plan Note (Signed)
Followed by Dr Thea Silversmith

## 2012-12-02 NOTE — Assessment & Plan Note (Signed)
Pt complaining of "bad dreams" on Metoprolol

## 2012-12-02 NOTE — Progress Notes (Signed)
12/02/2012 John Conley   [birth year]  [id number]  Primary Physician: Dr Thea Silversmith Primary Cardiologist: Dr Allyson Sabal  HPI:  The patient is a very pleasant, 67 year old, moderately overweight, married Caucasian male, father of 2 children (daughter John Conley is an employee here at [employer],) who is followed by Dr Allyson Sabal. He has a history of CAD status post coronary artery bypass grafting x5 in 2006. He had a LIMA to his LAD, a left radial to a ramus intermedius, a vein to the distal LAD and a vein to diagonal branch. He had an echo in March 2013 and a Myoview performed in September 2013 which was low risk with normal LV function by echo. He does have obstructive sleep apnea and is on CPAP. He has had left renal artery stenting in 2006 with followup Dopplers Sept 2013 that were normal. He had had some orthostatic dizziness in February this year and his medications were. An event monitor showed nonsustained ventricular tachycardia (8 bts)and some PAF (about 3 hours). Based on this, he was begun on low dose beta blocker and Eliquis. His CHADS2 VASc score was 4.             He is seen in the office today for 6 month check up. He has been doing well. He has not used C-pap for the past few weeks because of some URI symptoms. He also complained of "bad dreams" which he attributed to Metoprolol.   Current Outpatient Prescriptions  Medication Sig Dispense Refill  . amLODipine (NORVASC) 5 MG tablet Take 5 mg by mouth.      Marland Kitchen apixaban (ELIQUIS) 5 MG TABS tablet Take 1 tablet (5 mg total) by mouth 2 (two) times daily.  56 tablet  0  . aspirin 81 MG tablet Take 81 mg by mouth daily.      Marland Kitchen co-enzyme Q-10 30 MG capsule Take 100 mg by mouth 3 (three) times daily.      . fish oil-omega-3 fatty acids 1000 MG capsule Take 1 g by mouth daily.      . fluticasone (FLONASE) 50 MCG/ACT nasal spray Place 50 sprays into the nose.      . furosemide (LASIX) 40 MG tablet Take 40 mg by mouth.      . Grape Seed 100 MG CAPS Take  by mouth.      . irbesartan (AVAPRO) 300 MG tablet Take 300 mg by mouth.      Marland Kitchen KLOR-CON M20 20 MEQ tablet       . LEVEMIR 100 UNIT/ML injection Inject 40 Units into the skin. Taking 40 to -50 units daily      . levothyroxine (SYNTHROID, LEVOTHROID) 50 MCG tablet       . magnesium oxide (MAG-OX) 400 MG tablet Take 400 mg by mouth daily.      . metFORMIN (GLUCOPHAGE) 850 MG tablet       . NITROSTAT 0.4 MG SL tablet DISSOLVE 1 TABLET UNDER TONGUE EVERY 5 MINUTES UP TO 3 DOSES  25 tablet  11  . rosuvastatin (CRESTOR) 20 MG tablet Take 20 mg by mouth daily.      . sitaGLIPtin (JANUVIA) 100 MG tablet Take 100 mg by mouth daily.      Marland Kitchen terazosin (HYTRIN) 5 MG capsule       . nebivolol (BYSTOLIC) 2.5 MG tablet Take 1 tablet (2.5 mg total) by mouth daily.  30 tablet  11   No current facility-administered medications for this visit.    Allergies  Allergen Reactions  .  Metoprolol     Nightmares    History   Social History  . Marital Status: Married    Spouse Name: N/A    Number of Children: N/A  . Years of Education: N/A   Occupational History  . Not on file.   Social History Main Topics  . Smoking status: Former Smoker    Quit date: 12/02/1988  . Smokeless tobacco: Not on file  . Alcohol Use: Not on file  . Drug Use: Not on file  . Sexual Activity: Not on file   Other Topics Concern  . Not on file   Social History Narrative   Married, 2 childen     Review of Systems: General: negative for chills, fever, night sweats or weight changes.  Cardiovascular: negative for chest pain, dyspnea on exertion, edema, orthopnea, palpitations, paroxysmal nocturnal dyspnea or shortness of breath Dermatological: negative for rash Respiratory: negative for cough or wheezing Urologic: negative for hematuria Abdominal: negative for nausea, vomiting, diarrhea, bright red blood per rectum, melena, or hematemesis Neurologic: negative for visual changes, syncope, or dizziness All other systems  reviewed and are otherwise negative except as noted above.    Blood pressure 120/60, pulse 77, height 5\' 8"  (1.727 m), weight 227 lb (102.967 kg).  General appearance: alert, cooperative, no distress and morbidly obese Neck: no carotid bruit and no JVD Lungs: clear to auscultation bilaterally Heart: regular rate and rhythm  EKG NSR, PACs  ASSESSMENT AND PLAN:   Medication reaction Pt complaining of "bad dreams" on Metoprolol  CAD - CABG X 5 '06. Low risk Nuc 9/13 No angina  Sleep apnea- on C-pap Not using last 3 weeks secondary to URI  Obesity BMI 34  PAF on Holter 3/14 NSR today  Chronic anticoagulation On Eliquis  Insulin dependent diabetes mellitus Followed by Dr Thea Silversmith  Dyslipidemia Followed By Dr Thea Silversmith   PLAN  I changed John Conley's Metoprolol to 2.5 mg Bystolic. He'll let us know how he is doing. He will follow up with Dr Allyson Sabal in 6 months.   Jaid Quirion KPA-C 12/02/2012 1:26 PM

## 2012-12-02 NOTE — Assessment & Plan Note (Signed)
Not using last 3 weeks secondary to URI

## 2012-12-03 ENCOUNTER — Encounter: Payer: Self-pay | Admitting: Cardiovascular Disease

## 2012-12-08 ENCOUNTER — Telehealth: Payer: Self-pay | Admitting: Cardiology

## 2012-12-08 MED ORDER — NEBIVOLOL HCL 5 MG PO TABS: 2.5000 mg | ORAL_TABLET | Freq: Every day | ORAL | Status: DC

## 2012-12-08 NOTE — Telephone Encounter (Signed)
Pt Called and said the Bystolic is too expensive.Is there something else he can take?

## 2012-12-08 NOTE — Telephone Encounter (Signed)
Returned call.  Left message to call back before 4pm.  Message samples will be left at front desk and message forwarded to Dr. Allyson Sabal to review for alternatives.  Message forwarded to K. Petra Kuba, RN to discuss w/ Dr. Allyson Sabal.  Pt saw Franky Macho, [place] on 10.29.14.

## 2012-12-10 MED ORDER — ATENOLOL 25 MG PO TABS: 12.5000 mg | ORAL_TABLET | Freq: Every day | ORAL | Status: DC

## 2012-12-10 NOTE — Telephone Encounter (Signed)
rx sent in for atenolol.  Patient notified of change in medication and is agreeable.  He will change as soon as he runs out of bystolic

## 2012-12-10 NOTE — Telephone Encounter (Signed)
Per Dr Allyson Sabal, okay to change to Tenormin 12.5 mg daily.  I left message for patient to return my call.

## 2013-01-18 ENCOUNTER — Other Ambulatory Visit: Payer: Self-pay | Admitting: Pharmacist Clinician (PhC)/ Clinical Pharmacy Specialist

## 2013-01-18 MED ORDER — APIXABAN 5 MG PO TABS: 5.0000 mg | ORAL_TABLET | Freq: Two times a day (BID) | ORAL | Status: DC

## 2013-02-26 ENCOUNTER — Telehealth: Payer: Self-pay | Admitting: *Deleted

## 2013-02-26 NOTE — Telephone Encounter (Signed)
Per [name], Rx went thru pharmacy w/o problem.  No PA needed.

## 2013-02-26 NOTE — Telephone Encounter (Signed)
Spoke with patient, he actually LM on my VM earlier, stating concerns over commercial for Xarelto and safety issues.  When spoke with patient, realized pt insurance changed, verified new insurance will cover Eliquis with PA.  Asked pt to call pharmacy and have PA paperwork sent to us.  Pt voiced understanding.

## 2013-02-26 NOTE — Telephone Encounter (Signed)
Pt is in need of an alternative medication from the Eliquis. Insurance will not cover this medication any more

## 2013-03-10 ENCOUNTER — Telehealth: Payer: Self-pay | Admitting: *Deleted

## 2013-03-10 NOTE — Telephone Encounter (Signed)
Pt called regarding a Rx refill for Potassium and he also wanted to know if his Eliquis refill was called in to the mail order ([facility])  JB

## 2013-03-11 MED ORDER — POTASSIUM CHLORIDE CRYS ER 20 MEQ PO TBCR: 20.0000 meq | EXTENDED_RELEASE_TABLET | Freq: Once | ORAL | Status: DC

## 2013-03-19 ENCOUNTER — Telehealth: Payer: Self-pay | Admitting: Cardiovascular Disease

## 2013-03-19 MED ORDER — POTASSIUM CHLORIDE CRYS ER 20 MEQ PO TBCR: 20.0000 meq | EXTENDED_RELEASE_TABLET | Freq: Every day | ORAL | Status: DC

## 2013-03-19 NOTE — Telephone Encounter (Signed)
ZOX#[id number]- Need directions on his Potassium. Please call today if possible.

## 2013-03-19 NOTE — Telephone Encounter (Signed)
Rx corrected and resent.  

## 2013-03-31 ENCOUNTER — Other Ambulatory Visit: Payer: Self-pay | Admitting: Pharmacist Clinician (PhC)/ Clinical Pharmacy Specialist

## 2013-03-31 MED ORDER — APIXABAN 5 MG PO TABS: 5.0000 mg | ORAL_TABLET | Freq: Two times a day (BID) | ORAL | Status: DC

## 2013-04-02 ENCOUNTER — Other Ambulatory Visit: Payer: Self-pay | Admitting: *Deleted

## 2013-04-02 ENCOUNTER — Telehealth: Payer: Self-pay | Admitting: Cardiovascular Disease

## 2013-04-02 MED ORDER — APIXABAN 5 MG PO TABS: 5.0000 mg | ORAL_TABLET | Freq: Two times a day (BID) | ORAL | Status: DC

## 2013-04-02 NOTE — Telephone Encounter (Signed)
Calling because he needs to have his prescription Eliquis and Potassium refilled thru [facility]  Please call if you have any questions

## 2013-04-02 NOTE — Telephone Encounter (Signed)
Spoke with [name], rx for Eliquis sent to pharmacy on Wednesday

## 2013-04-07 ENCOUNTER — Other Ambulatory Visit: Payer: Self-pay | Admitting: Pharmacist Clinician (PhC)/ Clinical Pharmacy Specialist

## 2013-04-07 MED ORDER — APIXABAN 5 MG PO TABS: 5.0000 mg | ORAL_TABLET | Freq: Two times a day (BID) | ORAL | Status: DC

## 2013-06-01 ENCOUNTER — Encounter: Payer: Self-pay | Admitting: *Deleted

## 2013-06-02 ENCOUNTER — Encounter: Payer: Self-pay | Admitting: Cardiovascular Disease

## 2013-06-02 ENCOUNTER — Ambulatory Visit: Payer: Medicare Other | Admitting: Cardiovascular Disease

## 2013-06-02 ENCOUNTER — Ambulatory Visit (INDEPENDENT_AMBULATORY_CARE_PROVIDER_SITE_OTHER): Payer: Medicare Other | Admitting: Cardiovascular Disease

## 2013-06-02 VITALS — BP 118/60 | HR 56 | Ht 68.0 in | Wt 226.0 lb

## 2013-06-02 DIAGNOSIS — E785 Hyperlipidemia, unspecified: Secondary | ICD-10-CM

## 2013-06-02 DIAGNOSIS — I480 Paroxysmal atrial fibrillation: Secondary | ICD-10-CM

## 2013-06-02 DIAGNOSIS — I739 Peripheral vascular disease, unspecified: Secondary | ICD-10-CM

## 2013-06-02 DIAGNOSIS — I701 Atherosclerosis of renal artery: Secondary | ICD-10-CM

## 2013-06-02 DIAGNOSIS — I2510 Atherosclerotic heart disease of native coronary artery without angina pectoris: Secondary | ICD-10-CM

## 2013-06-02 DIAGNOSIS — I4891 Unspecified atrial fibrillation: Secondary | ICD-10-CM

## 2013-06-02 NOTE — Assessment & Plan Note (Signed)
History of left renal artery stenting 06/04/04. We have been following this by duplex ultrasound. It has remained patent. Will recheck this

## 2013-06-02 NOTE — Patient Instructions (Signed)
  We will see you back in follow up in 6 months with an extender and 1 year with Dr Allyson SabalBerry  Dr Allyson SabalBerry has ordered : 1. Lexiscan Myoview- this is a test that looks at the blood flow to your heart muscle.  It takes approximately 2 1/2 hours. Please follow instruction sheet, as given.   2. renal artery duplex- During this test, an ultrasound is used to evaluate blood flow to the kidneys. Allow one hour for this exam. Do not eat after midnight the day before and avoid carbonated beverages. Take your medications as you usually do.

## 2013-06-02 NOTE — Assessment & Plan Note (Signed)
History of PAF on Eliquis maintaining sinus rhythm

## 2013-06-02 NOTE — Assessment & Plan Note (Signed)
History of coronary bypass grafting x5 in 2006. She LIMA to LAD, left radial to ramus intermedius branch, vein graft to the distal LAD and a vein to the diagonal branch. He is a negative Myoview stress test September 2013. He denies chest pain or shortness of breath. Since he is diabetic and has been 2 years since his last functional study nail 8 years post bypass grafting in the repeat a pharmacologic Myoview stress test.

## 2013-06-02 NOTE — Progress Notes (Signed)
06/02/2013 John Conley   11/06/1945  [id number]  Primary Physician No primary provider on file. Primary Cardiologist: Runell GessJonathan J. Tyna Huertas MD [name],[name],[name], [name]   HPI:  The patient is a very pleasant, 68 year old, moderately overweight, married Caucasian male, father of 2 children (daughter [name] is an employee here at [employer],) who I last saw in the office in March. He has a history of CAD status post coronary artery bypass grafting x5 in 2006. He had a LIMA to his LAD, a left radial to a ramus intermedius, a vein to the distal LAD and a vein to diagonal branch. He had a Myoview performed in September 2013 which was low risk, normal LV function. He does have obstructive sleep apnea on CPAP. He has also had left renal artery stenting in 2006 with followup Dopplers that were normal. He was complaining of some dizziness when he was seen a month ago. His meds were adjusted by his primary care doctor. He had an echo performed in our office that revealed normal LV systolic function and a Myoview that was normal as well. An event monitor showed nonsustained ventricular tachycardia and some PAF. Based on this, he was begun on low dose beta blocker and Eliquis because of his high CHADS2 VASc score of 4. [facility]. Since that time has remained clinically stable and is asymptomatic.    Current Outpatient Prescriptions  Medication Sig Dispense Refill  . amLODipine (NORVASC) 5 MG tablet Take 5 mg by mouth.      Marland Kitchen. apixaban (ELIQUIS) 5 MG TABS tablet Take 1 tablet (5 mg total) by mouth 2 (two) times daily.  42 tablet  0  . aspirin 81 MG tablet Take 81 mg by mouth daily.      Marland Kitchen. atenolol (TENORMIN) 25 MG tablet Take 0.5 tablets (12.5 mg total) by mouth daily.  15 tablet  6  . fish oil-omega-3 fatty acids 1000 MG capsule Take 1 g by mouth daily.      . fluticasone (FLONASE) 50 MCG/ACT nasal spray Place 50 sprays into the nose.      . furosemide (LASIX) 40 MG  tablet Take 40 mg by mouth.      . Grape Seed 100 MG CAPS Take by mouth.      . irbesartan (AVAPRO) 300 MG tablet Take 300 mg by mouth.      Marland Kitchen. LEVEMIR 100 UNIT/ML injection Inject 40 Units into the skin. Taking 40 to -50 units daily      . levothyroxine (SYNTHROID, LEVOTHROID) 50 MCG tablet       . magnesium oxide (MAG-OX) 400 MG tablet Take 400 mg by mouth daily.      . metFORMIN (GLUCOPHAGE) 850 MG tablet Take 850 mg by mouth 3 (three) times daily.       . Misc Natural Products (PROSTATE HEALTH PO) Take 1 tablet by mouth daily.      . Multiple Vitamin (MULTIVITAMIN) capsule Take 1 capsule by mouth daily.      Marland Kitchen. NITROSTAT 0.4 MG SL tablet DISSOLVE 1 TABLET UNDER TONGUE EVERY 5 MINUTES UP TO 3 DOSES  25 tablet  11  . NON FORMULARY OTC supplement with co Q 10. Tru Heart 2 tabs daily      . potassium chloride SA (KLOR-CON M20) 20 MEQ tablet Take 1 tablet (20 mEq total) by mouth daily.  90 tablet  1  . rosuvastatin (CRESTOR) 20 MG tablet Take 20 mg by mouth  daily.      . sitaGLIPtin (JANUVIA) 100 MG tablet Take 100 mg by mouth daily.      Marland Kitchen. terazosin (HYTRIN) 5 MG capsule Take 5 mg by mouth once.        No current facility-administered medications for this visit.    Allergies  Allergen Reactions  . Metoprolol     Nightmares    History   Social History  . Marital Status: Married    Spouse Name: N/A    Number of Children: 2  . Years of Education: N/A   Occupational History  .  [employer]   Social History Main Topics  . Smoking status: Former Smoker    Quit date: 12/02/1988  . Smokeless tobacco: Not on file  . Alcohol Use: Not on file  . Drug Use: Not on file  . Sexual Activity: Not on file   Other Topics Concern  . Not on file   Social History Narrative   Married, 2 childen     Review of Systems: General: negative for chills, fever, night sweats or weight changes.  Cardiovascular: negative for chest pain, dyspnea on exertion, edema, orthopnea, palpitations,  paroxysmal nocturnal dyspnea or shortness of breath Dermatological: negative for rash Respiratory: negative for cough or wheezing Urologic: negative for hematuria Abdominal: negative for nausea, vomiting, diarrhea, bright red blood per rectum, melena, or hematemesis Neurologic: negative for visual changes, syncope, or dizziness All other systems reviewed and are otherwise negative except as noted above.    Blood pressure 118/60, pulse 56, height 5\' 8"  (1.727 m), weight 226 lb (102.513 kg).  General appearance: alert and no distress Neck: no adenopathy, no carotid bruit, no JVD, supple, symmetrical, trachea midline and thyroid not enlarged, symmetric, no tenderness/mass/nodules Lungs: clear to auscultation bilaterally Heart: regular rate and rhythm, S1, S2 normal, no murmur, click, rub or gallop Extremities: extremities normal, atraumatic, no cyanosis or edema  EKG bradycardia at 56 with nonspecific ST and T wave changes  ASSESSMENT AND PLAN:   CAD - CABG X 5 '06. Low risk Nuc 9/13 History of coronary bypass grafting x5 in 2006. She LIMA to LAD, left radial to ramus intermedius branch, vein graft to the distal LAD and a vein to the diagonal branch. He is a negative Myoview stress test September 2013. He denies chest pain or shortness of breath. Since he is diabetic and has been 2 years since his last functional study nail 8 years post bypass grafting in the repeat a pharmacologic Myoview stress test.  Dyslipidemia On statin therapy followed by his PCP  PVD - Lt RAS s/p stent 5/06. Dopplers OK 9/13 History of left renal artery stenting 06/04/04. We have been following this by duplex ultrasound. It has remained patent. Will recheck this  PAF on Holter 3/14 History of PAF on Eliquis maintaining sinus rhythm      Runell GessJonathan J. Steffani Dionisio MD [facility] HospitalFACP,[name],[name], [facility]SCAI 06/02/2013 2:12 PM

## 2013-06-02 NOTE — Assessment & Plan Note (Signed)
On statin therapy followed by his PCP 

## 2013-07-07 ENCOUNTER — Telehealth (HOSPITAL_COMMUNITY): Payer: Self-pay

## 2013-07-13 ENCOUNTER — Ambulatory Visit (HOSPITAL_BASED_OUTPATIENT_CLINIC_OR_DEPARTMENT_OTHER)
Admission: RE | Admit: 2013-07-13 | Discharge: 2013-07-13 | Disposition: A | Discharge status: Home / Self Care | Payer: Medicare Other | Source: Ambulatory Visit | Attending: Cardiovascular Disease | Admitting: Cardiovascular Disease

## 2013-07-13 ENCOUNTER — Ambulatory Visit (HOSPITAL_COMMUNITY)
Admission: RE | Admit: 2013-07-13 | Discharge: 2013-07-13 | Disposition: A | Discharge status: Home / Self Care | Payer: Medicare Other | Source: Ambulatory Visit | Attending: Cardiovascular Disease | Admitting: Cardiovascular Disease

## 2013-07-13 DIAGNOSIS — I701 Atherosclerosis of renal artery: Secondary | ICD-10-CM

## 2013-07-13 DIAGNOSIS — Z951 Presence of aortocoronary bypass graft: Secondary | ICD-10-CM | POA: Insufficient documentation

## 2013-07-13 DIAGNOSIS — E119 Type 2 diabetes mellitus without complications: Secondary | ICD-10-CM | POA: Insufficient documentation

## 2013-07-13 DIAGNOSIS — Z87891 Personal history of nicotine dependence: Secondary | ICD-10-CM | POA: Insufficient documentation

## 2013-07-13 DIAGNOSIS — E669 Obesity, unspecified: Secondary | ICD-10-CM | POA: Insufficient documentation

## 2013-07-13 DIAGNOSIS — I10 Essential (primary) hypertension: Secondary | ICD-10-CM | POA: Insufficient documentation

## 2013-07-13 DIAGNOSIS — I2510 Atherosclerotic heart disease of native coronary artery without angina pectoris: Secondary | ICD-10-CM

## 2013-07-13 DIAGNOSIS — Z794 Long term (current) use of insulin: Secondary | ICD-10-CM | POA: Insufficient documentation

## 2013-07-13 DIAGNOSIS — I739 Peripheral vascular disease, unspecified: Secondary | ICD-10-CM | POA: Insufficient documentation

## 2013-07-13 MED ORDER — TECHNETIUM TC 99M SESTAMIBI GENERIC - CARDIOLITE
10.2000 | Freq: Once | INTRAVENOUS | Status: AC | PRN
Start: 2013-07-13 — End: 2013-07-13
  Administered 2013-07-13: 10 via INTRAVENOUS

## 2013-07-13 MED ORDER — REGADENOSON 0.4 MG/5ML IV SOLN
0.4000 mg | Freq: Once | INTRAVENOUS | Status: AC
  Administered 2013-07-13: 0.4 mg via INTRAVENOUS

## 2013-07-13 MED ORDER — TECHNETIUM TC 99M SESTAMIBI GENERIC - CARDIOLITE
31.0000 | Freq: Once | INTRAVENOUS | Status: AC | PRN
  Administered 2013-07-13: 31 via INTRAVENOUS

## 2013-07-13 NOTE — Progress Notes (Signed)
Renal Duplex Completed. John Conley, BS, RDMS, RVT  

## 2013-07-13 NOTE — Procedures (Addendum)
[facility] [address] [place] [address] [phone]  Cardiology Nuclear Med Study  John Conley is a 68 y.o. male     MRN : [id number]     DOB: Dec 23, 1945  Procedure Date: 07/13/2013  Nuclear Med Background Indication for Stress Test:  Graft Patency History:  CAD, CABGx5 2006, prior nuclear MPI 9/13 nonischemic, not gated Cardiac Risk Factors: History of Smoking, Hypertension, IDDM Type 2, Lipids, Obesity and PVD  Symptoms:  None   Nuclear Pre-Procedure Caffeine/Decaff Intake:  7:00pm NPO After: 5:00am   IV Site: R Forearm  IV 0.9% NS with Angio Cath:  22g  Chest Size (in):  48 IV Started by: Koren Shiver, CNMT  Height: 5\' 8"  (1.727 m)  Cup Size: n/a  BMI:  Body mass index is 34.37 kg/(m^2). Weight:  226 lb (102.513 kg)   Tech Comments:  n/a    Nuclear Med Study 1 or 2 day study: 1 day  Stress Test Type:  Lexiscan  Order Authorizing Provider:  Nanetta Batty, MD   Resting Radionuclide: Technetium 54m Sestamibi  Resting Radionuclide Dose: 10.2 mCi   Stress Radionuclide:  Technetium 49m Sestamibi  Stress Radionuclide Dose: 31.0 mCi           Stress Protocol Rest HR: 55 Stress HR: 61  Rest BP: 151/71 Stress BP: 150/49  Exercise Time (min): n/a METS: n/a   Predicted Max HR: 152 bpm % Max HR: 44.08 bpm Rate Pressure Product: 97673  Dose of Adenosine (mg):  n/a Dose of Lexiscan: 0.4 mg  Dose of Atropine (mg): n/a Dose of Dobutamine: n/a mcg/kg/min (at max HR)  Stress Test Technologist: Esperanza Sheets, CCT Nuclear Technologist: Gonzella Lex, CNMT   Rest Procedure:  Myocardial perfusion imaging was performed at rest 45 minutes following the intravenous administration of Technetium 43m Sestamibi. Stress Procedure:  The patient received IV Lexiscan 0.4 mg over 15-seconds.  Technetium 105m Sestamibi injected IV at 30-seconds.  There were no significant changes with Lexiscan.  Quantitative spect images  were obtained after a 45 minute delay.  Transient Ischemic Dilatation (Normal <1.22):  1.31  QGS EDV:  105 ml QGS ESV:  48 ml LV Ejection Fraction: 55%  PHYSICIAN INTERPRETATION:  Rest ECG: NSR with non-specific ST-T wave changes  Stress ECG: No significant change from baseline ECG and No significant ST segment change suggestive of ischemia.  QPS Raw Data Images:  Mild diaphragmatic attenuation.  Normal left ventricular size. Stress Images:  There is mild apical thinning with normal uptake in other regions. Rest Images:  There is mild apical thinning with normal uptake in other regions. Subtraction (SDS): Mild apical thinning & diaphragmatic attenuation. There is no evidence of scar or ischemia.  LV Wall Motion:  NL LV Function; NL Wall Motion  Impression Exercise Capacity:  Lexiscan with no exercise. BP Response:  Normal blood pressure response. Clinical Symptoms:  No significant symptoms noted. ECG Impression:  No significant ECG changes with Lexiscan. Comparison with Prior Nuclear Study: No significant change from previous study  Overall Impression:  Normal stress nuclear study. and Low risk stress nuclear study wtih minimal apical thinning & diaphragmatic attenuation.[name], MD  07/13/2013 2:17 PM

## 2013-08-12 NOTE — Telephone Encounter (Signed)
Encounter complete. 

## 2013-08-24 ENCOUNTER — Other Ambulatory Visit: Payer: Self-pay | Admitting: Pharmacist Clinician (PhC)/ Clinical Pharmacy Specialist

## 2013-08-24 MED ORDER — APIXABAN 5 MG PO TABS: 5.0000 mg | ORAL_TABLET | Freq: Two times a day (BID) | ORAL | Status: DC

## 2013-08-24 NOTE — Telephone Encounter (Signed)
Pt called requesting written rx for Eliquis, would like to compare cash prices at various pharmacies.  Rx printed and signed by Dr. Allyson SabalBerry.  Pt notified to p/u at his convenience

## 2013-10-01 ENCOUNTER — Other Ambulatory Visit: Payer: Self-pay | Admitting: Cardiovascular Disease

## 2013-10-01 NOTE — Telephone Encounter (Signed)
Rx was sent to pharmacy electronically. 

## 2013-10-25 ENCOUNTER — Other Ambulatory Visit: Payer: Self-pay | Admitting: Pharmacist Clinician (PhC)/ Clinical Pharmacy Specialist

## 2013-10-25 MED ORDER — POTASSIUM CHLORIDE CRYS ER 20 MEQ PO TBCR: 20.0000 meq | EXTENDED_RELEASE_TABLET | Freq: Every day | ORAL | Status: DC

## 2013-12-10 ENCOUNTER — Telehealth: Payer: Self-pay | Admitting: Pharmacist Clinician (PhC)/ Clinical Pharmacy Specialist

## 2013-12-10 MED ORDER — APIXABAN 5 MG PO TABS: 5.0000 mg | ORAL_TABLET | Freq: Two times a day (BID) | ORAL | Status: DC

## 2013-12-10 NOTE — Telephone Encounter (Signed)
Pt LMOM requesting samples of Eliquis  LMOM for him, samples left at front desk.

## 2014-01-17 ENCOUNTER — Other Ambulatory Visit: Payer: Self-pay | Admitting: Cardiovascular Disease

## 2014-01-17 NOTE — Telephone Encounter (Signed)
Rx was sent to pharmacy electronically. 

## 2014-02-08 ENCOUNTER — Other Ambulatory Visit: Payer: Self-pay | Admitting: *Deleted

## 2014-02-08 ENCOUNTER — Other Ambulatory Visit: Payer: Self-pay | Admitting: Pharmacist Clinician (PhC)/ Clinical Pharmacy Specialist

## 2014-02-08 MED ORDER — ATENOLOL 25 MG PO TABS: 12.5000 mg | ORAL_TABLET | Freq: Every day | ORAL | Status: DC

## 2014-02-08 MED ORDER — APIXABAN 5 MG PO TABS: 5.0000 mg | ORAL_TABLET | Freq: Two times a day (BID) | ORAL | Status: DC

## 2014-02-09 DIAGNOSIS — M545 Low back pain: Secondary | ICD-10-CM | POA: Diagnosis not present

## 2014-02-09 DIAGNOSIS — M25552 Pain in left hip: Secondary | ICD-10-CM | POA: Diagnosis not present

## 2014-02-09 DIAGNOSIS — M25551 Pain in right hip: Secondary | ICD-10-CM | POA: Diagnosis not present

## 2014-02-23 DIAGNOSIS — M545 Low back pain: Secondary | ICD-10-CM | POA: Diagnosis not present

## 2014-02-23 DIAGNOSIS — M25551 Pain in right hip: Secondary | ICD-10-CM | POA: Diagnosis not present

## 2014-02-23 DIAGNOSIS — M25552 Pain in left hip: Secondary | ICD-10-CM | POA: Diagnosis not present

## 2014-02-24 DIAGNOSIS — E1165 Type 2 diabetes mellitus with hyperglycemia: Secondary | ICD-10-CM | POA: Diagnosis not present

## 2014-02-24 DIAGNOSIS — I10 Essential (primary) hypertension: Secondary | ICD-10-CM | POA: Diagnosis not present

## 2014-02-24 DIAGNOSIS — E039 Hypothyroidism, unspecified: Secondary | ICD-10-CM | POA: Diagnosis not present

## 2014-02-24 DIAGNOSIS — E785 Hyperlipidemia, unspecified: Secondary | ICD-10-CM | POA: Diagnosis not present

## 2014-03-01 DIAGNOSIS — M545 Low back pain: Secondary | ICD-10-CM | POA: Diagnosis not present

## 2014-03-01 DIAGNOSIS — M25552 Pain in left hip: Secondary | ICD-10-CM | POA: Diagnosis not present

## 2014-03-01 DIAGNOSIS — M25551 Pain in right hip: Secondary | ICD-10-CM | POA: Diagnosis not present

## 2014-03-08 ENCOUNTER — Ambulatory Visit (INDEPENDENT_AMBULATORY_CARE_PROVIDER_SITE_OTHER): Payer: Medicare Other | Admitting: Cardiovascular Disease

## 2014-03-08 ENCOUNTER — Encounter: Payer: Self-pay | Admitting: Cardiovascular Disease

## 2014-03-08 VITALS — BP 158/70 | HR 56 | Ht 68.0 in | Wt 238.4 lb

## 2014-03-08 DIAGNOSIS — I2583 Coronary atherosclerosis due to lipid rich plaque: Secondary | ICD-10-CM

## 2014-03-08 DIAGNOSIS — I480 Paroxysmal atrial fibrillation: Secondary | ICD-10-CM

## 2014-03-08 DIAGNOSIS — I472 Ventricular tachycardia: Secondary | ICD-10-CM | POA: Diagnosis not present

## 2014-03-08 DIAGNOSIS — I739 Peripheral vascular disease, unspecified: Secondary | ICD-10-CM

## 2014-03-08 DIAGNOSIS — I4729 Other ventricular tachycardia: Secondary | ICD-10-CM

## 2014-03-08 DIAGNOSIS — I2510 Atherosclerotic heart disease of native coronary artery without angina pectoris: Secondary | ICD-10-CM

## 2014-03-08 DIAGNOSIS — G4730 Sleep apnea, unspecified: Secondary | ICD-10-CM | POA: Diagnosis not present

## 2014-03-08 DIAGNOSIS — E785 Hyperlipidemia, unspecified: Secondary | ICD-10-CM

## 2014-03-08 NOTE — Assessment & Plan Note (Signed)
History of PAF maintaining sinus rhythm. He is on Eliquis oral anticoagulation

## 2014-03-08 NOTE — Patient Instructions (Signed)
Your physician wants you to follow-up in: 1 year with Dr Berry. You will receive a reminder letter in the mail two months in advance. If you don't receive a letter, please call our office to schedule the follow-up appointment.  

## 2014-03-08 NOTE — Assessment & Plan Note (Signed)
History of sleep apnea not compliant with C Pap

## 2014-03-08 NOTE — Assessment & Plan Note (Signed)
History of hyperlipidemia on Crestor 20 mg a day followed by his PCP 

## 2014-03-08 NOTE — Progress Notes (Signed)
03/08/2014 John Conley   November 29, 1945  [id number]  Primary Physician John HeadingsMACKENZIE,BRIAN, MD Primary Cardiologist: Runell GessJonathan J. Aryella Besecker MD John RenoFACP,[name],[name], [name]   HPI:  The patient is a very pleasant, 69 year old, moderately overweight, married Caucasian male, father of 2 children (daughter [name] is an employee here at [employer],) who I last saw in the office 06/02/13.Marland Kitchen. He has a history of CAD status post coronary artery bypass grafting x5 in 2006. He had a LIMA to his LAD, a left radial to a ramus intermedius, a vein to the distal LAD and a vein to diagonal branch. He had a Myoview performed in September 2013 which was low risk, normal LV function. He does have obstructive sleep apnea although he is noncompliant with his CPAP. He has also had left renal artery stenting in 2006 with followup Dopplers that were normal. He was complaining of some dizziness when he was seen a month ago. His meds were adjusted by his primary care doctor. He had an echo performed in our office that revealed normal LV systolic function and a Myoview that was normal as well. An event monitor showed nonsustained ventricular tachycardia and some PAF. Based on this, he was begun on low dose beta blocker and Eliquis because of his high CHADS2 VASc score of 4. Since I saw him almost one year ago he has remained clinically stable and denies any symptoms.   Current Outpatient Prescriptions  Medication Sig Dispense Refill  . amLODipine (NORVASC) 5 MG tablet Take 5 mg by mouth.    Marland Kitchen. apixaban (ELIQUIS) 5 MG TABS tablet Take 1 tablet (5 mg total) by mouth 2 (two) times daily. 60 tablet 5  . aspirin 81 MG tablet Take 81 mg by mouth daily.    Marland Kitchen. atenolol (TENORMIN) 25 MG tablet Take 0.5 tablets (12.5 mg total) by mouth daily. 45 tablet 0  . fish oil-omega-3 fatty acids 1000 MG capsule Take 1 g by mouth daily.    . fluticasone (FLONASE) 50 MCG/ACT nasal spray Place 2 sprays into the nose.     . furosemide (LASIX) 40 MG tablet  Take 1 tablet by mouth daily.    . Grape Seed 100 MG CAPS Take by mouth.    . irbesartan (AVAPRO) 300 MG tablet Take 300 mg by mouth.    . levothyroxine (SYNTHROID, LEVOTHROID) 50 MCG tablet     . magnesium oxide (MAG-OX) 400 MG tablet Take 400 mg by mouth daily.    . metFORMIN (GLUCOPHAGE) 850 MG tablet Take 850 mg by mouth 3 (three) times daily.     . Misc Natural Products (PROSTATE HEALTH PO) Take 1 tablet by mouth daily.    . Multiple Vitamin (MULTIVITAMIN) capsule Take 1 capsule by mouth daily.    Marland Kitchen. NITROSTAT 0.4 MG SL tablet DISSOLVE 1 TABLET UNDER TONGUE EVERY 5 MINUTES UP TO 3 DOSES 25 tablet 2  . NON FORMULARY OTC supplement with co Q 10. Tru Heart 2 tabs daily    . potassium chloride SA (KLOR-CON M20) 20 MEQ tablet Take 1 tablet (20 mEq total) by mouth daily. 90 tablet 2  . rosuvastatin (CRESTOR) 20 MG tablet Take 20 mg by mouth daily.    . sitaGLIPtin (JANUVIA) 100 MG tablet Take 100 mg by mouth daily.    Marland Kitchen. terazosin (HYTRIN) 5 MG capsule Take 5 mg by mouth once.      No current facility-administered medications for this visit.    Allergies  Allergen Reactions  . Metoprolol  Nightmares    History   Social History  . Marital Status: Married    Spouse Name: N/A    Number of Children: 2  . Years of Education: N/A   Occupational History  .  [employer]   Social History Main Topics  . Smoking status: Former Smoker    Quit date: 12/02/1988  . Smokeless tobacco: Not on file  . Alcohol Use: Not on file  . Drug Use: Not on file  . Sexual Activity: Not on file   Other Topics Concern  . Not on file   Social History Narrative   Married, 2 childen     Review of Systems: General: negative for chills, fever, night sweats or weight changes.  Cardiovascular: negative for chest pain, dyspnea on exertion, edema, orthopnea, palpitations, paroxysmal nocturnal dyspnea or shortness of breath Dermatological: negative for rash Respiratory: negative for cough or  wheezing Urologic: negative for hematuria Abdominal: negative for nausea, vomiting, diarrhea, bright red blood per rectum, melena, or hematemesis Neurologic: negative for visual changes, syncope, or dizziness All other systems reviewed and are otherwise negative except as noted above.    Blood pressure 158/70, pulse 56, height  (1.727 m), weight 238 lb 6.4 oz (108.138 kg).  General appearance: alert and no distress Neck: no adenopathy, no carotid bruit, no JVD, supple, symmetrical, trachea midline and thyroid not enlarged, symmetric, no tenderness/mass/nodules Lungs: clear to auscultation bilaterally Heart: regular rate and rhythm, S1, S2 normal, no murmur, click, rub or gallop Extremities: extremities normal, atraumatic, no cyanosis or edema  EKG sinus bradycardia at 56 with septal Q waves. I briefly reviewed this EKG  ASSESSMENT AND PLAN:   Sleep apnea- on C-pap History of sleep apnea not compliant with C Pap   PVD - Lt RAS s/p stent 5/06. 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   PAF on Holter 3/14 History of PAF maintaining sinus rhythm. He is on Eliquis oral anticoagulation   Dyslipidemia History of hyperlipidemia on Crestor 20 mg a day followed by his PCP   CAD - CABG X 5 '06. Low risk Nuc 9/13 History of CAD status post coronary artery bypass grafting in 2006. He had 5 grafts placed including a LIMA to the LAD, left radial to ramus intermedius branch, vein to the distal LAD and diagonal branch. Myoview performed 05/13/13 was low risk. He denies chest pain or shortness of breath       Runell Gess MD [facility], [facility] 03/08/2014 8:44 AM

## 2014-03-08 NOTE — Assessment & Plan Note (Signed)
History of CAD status post coronary artery bypass grafting in 2006. He had 5 grafts placed including a LIMA to the LAD, left radial to ramus intermedius branch, vein to the distal LAD and diagonal branch. Myoview performed 05/13/13 was low risk. He denies chest pain or shortness of breath

## 2014-03-08 NOTE — Assessment & Plan Note (Signed)
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

## 2014-03-09 DIAGNOSIS — M25551 Pain in right hip: Secondary | ICD-10-CM | POA: Diagnosis not present

## 2014-03-09 DIAGNOSIS — M25552 Pain in left hip: Secondary | ICD-10-CM | POA: Diagnosis not present

## 2014-03-09 DIAGNOSIS — M545 Low back pain: Secondary | ICD-10-CM | POA: Diagnosis not present

## 2014-03-16 DIAGNOSIS — M25552 Pain in left hip: Secondary | ICD-10-CM | POA: Diagnosis not present

## 2014-03-16 DIAGNOSIS — M25551 Pain in right hip: Secondary | ICD-10-CM | POA: Diagnosis not present

## 2014-03-16 DIAGNOSIS — M545 Low back pain: Secondary | ICD-10-CM | POA: Diagnosis not present

## 2014-03-30 DIAGNOSIS — M25552 Pain in left hip: Secondary | ICD-10-CM | POA: Diagnosis not present

## 2014-03-30 DIAGNOSIS — E119 Type 2 diabetes mellitus without complications: Secondary | ICD-10-CM | POA: Diagnosis not present

## 2014-03-30 DIAGNOSIS — H903 Sensorineural hearing loss, bilateral: Secondary | ICD-10-CM | POA: Diagnosis not present

## 2014-03-30 DIAGNOSIS — M545 Low back pain: Secondary | ICD-10-CM | POA: Diagnosis not present

## 2014-03-30 DIAGNOSIS — M25551 Pain in right hip: Secondary | ICD-10-CM | POA: Diagnosis not present

## 2014-03-30 DIAGNOSIS — H9313 Tinnitus, bilateral: Secondary | ICD-10-CM | POA: Diagnosis not present

## 2014-04-12 DIAGNOSIS — M9905 Segmental and somatic dysfunction of pelvic region: Secondary | ICD-10-CM | POA: Diagnosis not present

## 2014-04-12 DIAGNOSIS — M545 Low back pain: Secondary | ICD-10-CM | POA: Diagnosis not present

## 2014-04-19 DIAGNOSIS — I480 Paroxysmal atrial fibrillation: Secondary | ICD-10-CM | POA: Diagnosis not present

## 2014-04-19 DIAGNOSIS — M545 Low back pain: Secondary | ICD-10-CM | POA: Diagnosis not present

## 2014-04-19 DIAGNOSIS — E1165 Type 2 diabetes mellitus with hyperglycemia: Secondary | ICD-10-CM | POA: Diagnosis not present

## 2014-04-19 DIAGNOSIS — M9905 Segmental and somatic dysfunction of pelvic region: Secondary | ICD-10-CM | POA: Diagnosis not present

## 2014-04-19 DIAGNOSIS — I10 Essential (primary) hypertension: Secondary | ICD-10-CM | POA: Diagnosis not present

## 2014-04-19 DIAGNOSIS — E785 Hyperlipidemia, unspecified: Secondary | ICD-10-CM | POA: Diagnosis not present

## 2014-05-23 DIAGNOSIS — H26492 Other secondary cataract, left eye: Secondary | ICD-10-CM | POA: Diagnosis not present

## 2014-05-23 DIAGNOSIS — Z961 Presence of intraocular lens: Secondary | ICD-10-CM | POA: Diagnosis not present

## 2014-05-23 DIAGNOSIS — E119 Type 2 diabetes mellitus without complications: Secondary | ICD-10-CM | POA: Diagnosis not present

## 2014-05-24 DIAGNOSIS — M545 Low back pain: Secondary | ICD-10-CM | POA: Diagnosis not present

## 2014-05-24 DIAGNOSIS — M9905 Segmental and somatic dysfunction of pelvic region: Secondary | ICD-10-CM | POA: Diagnosis not present

## 2014-05-29 ENCOUNTER — Other Ambulatory Visit: Payer: Self-pay | Admitting: Cardiovascular Disease

## 2014-05-30 NOTE — Telephone Encounter (Signed)
Rx has been sent to the pharmacy electronically. ° °

## 2014-06-02 ENCOUNTER — Other Ambulatory Visit: Payer: Self-pay | Admitting: Cardiovascular Disease

## 2014-06-13 DIAGNOSIS — I10 Essential (primary) hypertension: Secondary | ICD-10-CM | POA: Diagnosis not present

## 2014-06-13 DIAGNOSIS — Z0000 Encounter for general adult medical examination without abnormal findings: Secondary | ICD-10-CM | POA: Diagnosis not present

## 2014-06-13 DIAGNOSIS — E1122 Type 2 diabetes mellitus with diabetic chronic kidney disease: Secondary | ICD-10-CM | POA: Diagnosis not present

## 2014-06-13 DIAGNOSIS — Z1389 Encounter for screening for other disorder: Secondary | ICD-10-CM | POA: Diagnosis not present

## 2014-06-13 DIAGNOSIS — Z125 Encounter for screening for malignant neoplasm of prostate: Secondary | ICD-10-CM | POA: Diagnosis not present

## 2014-06-13 DIAGNOSIS — Z23 Encounter for immunization: Secondary | ICD-10-CM | POA: Diagnosis not present

## 2014-06-13 DIAGNOSIS — E119 Type 2 diabetes mellitus without complications: Secondary | ICD-10-CM | POA: Diagnosis not present

## 2014-06-20 DIAGNOSIS — E785 Hyperlipidemia, unspecified: Secondary | ICD-10-CM | POA: Diagnosis not present

## 2014-06-20 DIAGNOSIS — L821 Other seborrheic keratosis: Secondary | ICD-10-CM | POA: Diagnosis not present

## 2014-06-20 DIAGNOSIS — I10 Essential (primary) hypertension: Secondary | ICD-10-CM | POA: Diagnosis not present

## 2014-06-20 DIAGNOSIS — E1165 Type 2 diabetes mellitus with hyperglycemia: Secondary | ICD-10-CM | POA: Diagnosis not present

## 2014-06-20 DIAGNOSIS — E1129 Type 2 diabetes mellitus with other diabetic kidney complication: Secondary | ICD-10-CM | POA: Diagnosis not present

## 2014-08-16 DIAGNOSIS — N528 Other male erectile dysfunction: Secondary | ICD-10-CM | POA: Diagnosis not present

## 2014-08-16 DIAGNOSIS — N401 Enlarged prostate with lower urinary tract symptoms: Secondary | ICD-10-CM | POA: Diagnosis not present

## 2014-10-19 DIAGNOSIS — E538 Deficiency of other specified B group vitamins: Secondary | ICD-10-CM | POA: Diagnosis not present

## 2014-10-19 DIAGNOSIS — E1129 Type 2 diabetes mellitus with other diabetic kidney complication: Secondary | ICD-10-CM | POA: Diagnosis not present

## 2014-10-19 DIAGNOSIS — I10 Essential (primary) hypertension: Secondary | ICD-10-CM | POA: Diagnosis not present

## 2014-10-20 DIAGNOSIS — I10 Essential (primary) hypertension: Secondary | ICD-10-CM | POA: Diagnosis not present

## 2014-10-20 DIAGNOSIS — E785 Hyperlipidemia, unspecified: Secondary | ICD-10-CM | POA: Diagnosis not present

## 2014-10-20 DIAGNOSIS — E1165 Type 2 diabetes mellitus with hyperglycemia: Secondary | ICD-10-CM | POA: Diagnosis not present

## 2014-10-20 DIAGNOSIS — I4891 Unspecified atrial fibrillation: Secondary | ICD-10-CM | POA: Diagnosis not present

## 2014-11-09 DIAGNOSIS — E039 Hypothyroidism, unspecified: Secondary | ICD-10-CM | POA: Diagnosis not present

## 2014-11-09 DIAGNOSIS — E1129 Type 2 diabetes mellitus with other diabetic kidney complication: Secondary | ICD-10-CM | POA: Diagnosis not present

## 2014-11-09 DIAGNOSIS — Z23 Encounter for immunization: Secondary | ICD-10-CM | POA: Diagnosis not present

## 2014-11-09 DIAGNOSIS — E785 Hyperlipidemia, unspecified: Secondary | ICD-10-CM | POA: Diagnosis not present

## 2014-11-09 DIAGNOSIS — I10 Essential (primary) hypertension: Secondary | ICD-10-CM | POA: Diagnosis not present

## 2014-12-08 ENCOUNTER — Other Ambulatory Visit: Payer: Self-pay | Admitting: Cardiovascular Disease

## 2015-01-04 ENCOUNTER — Other Ambulatory Visit: Payer: Self-pay | Admitting: Pharmacist Clinician (PhC)/ Clinical Pharmacy Specialist

## 2015-01-04 MED ORDER — APIXABAN 5 MG PO TABS: 5.0000 mg | ORAL_TABLET | Freq: Two times a day (BID) | ORAL | Status: DC

## 2015-01-17 DIAGNOSIS — E785 Hyperlipidemia, unspecified: Secondary | ICD-10-CM | POA: Diagnosis not present

## 2015-01-17 DIAGNOSIS — I4891 Unspecified atrial fibrillation: Secondary | ICD-10-CM | POA: Diagnosis not present

## 2015-01-17 DIAGNOSIS — E1129 Type 2 diabetes mellitus with other diabetic kidney complication: Secondary | ICD-10-CM | POA: Diagnosis not present

## 2015-01-17 DIAGNOSIS — E039 Hypothyroidism, unspecified: Secondary | ICD-10-CM | POA: Diagnosis not present

## 2015-01-17 DIAGNOSIS — E538 Deficiency of other specified B group vitamins: Secondary | ICD-10-CM | POA: Diagnosis not present

## 2015-01-18 DIAGNOSIS — E1129 Type 2 diabetes mellitus with other diabetic kidney complication: Secondary | ICD-10-CM | POA: Diagnosis not present

## 2015-01-18 DIAGNOSIS — E785 Hyperlipidemia, unspecified: Secondary | ICD-10-CM | POA: Diagnosis not present

## 2015-01-19 DIAGNOSIS — I10 Essential (primary) hypertension: Secondary | ICD-10-CM | POA: Diagnosis not present

## 2015-01-19 DIAGNOSIS — E785 Hyperlipidemia, unspecified: Secondary | ICD-10-CM | POA: Diagnosis not present

## 2015-01-19 DIAGNOSIS — E1165 Type 2 diabetes mellitus with hyperglycemia: Secondary | ICD-10-CM | POA: Diagnosis not present

## 2015-02-13 DIAGNOSIS — E1165 Type 2 diabetes mellitus with hyperglycemia: Secondary | ICD-10-CM | POA: Diagnosis not present

## 2015-02-15 ENCOUNTER — Other Ambulatory Visit: Payer: Self-pay | Admitting: Cardiovascular Disease

## 2015-02-15 NOTE — Telephone Encounter (Signed)
Rx(s) sent to pharmacy electronically.  

## 2015-02-27 DIAGNOSIS — M9905 Segmental and somatic dysfunction of pelvic region: Secondary | ICD-10-CM | POA: Diagnosis not present

## 2015-02-27 DIAGNOSIS — M545 Low back pain: Secondary | ICD-10-CM | POA: Diagnosis not present

## 2015-03-21 DIAGNOSIS — I129 Hypertensive chronic kidney disease with stage 1 through stage 4 chronic kidney disease, or unspecified chronic kidney disease: Secondary | ICD-10-CM | POA: Diagnosis not present

## 2015-03-21 DIAGNOSIS — E1122 Type 2 diabetes mellitus with diabetic chronic kidney disease: Secondary | ICD-10-CM | POA: Diagnosis not present

## 2015-03-21 DIAGNOSIS — I509 Heart failure, unspecified: Secondary | ICD-10-CM | POA: Diagnosis not present

## 2015-03-21 DIAGNOSIS — E785 Hyperlipidemia, unspecified: Secondary | ICD-10-CM | POA: Diagnosis not present

## 2015-03-28 ENCOUNTER — Encounter: Payer: Self-pay | Admitting: Cardiovascular Disease

## 2015-03-28 ENCOUNTER — Ambulatory Visit (INDEPENDENT_AMBULATORY_CARE_PROVIDER_SITE_OTHER): Payer: Medicare Other | Admitting: Cardiovascular Disease

## 2015-03-28 VITALS — BP 130/54 | HR 52 | Ht 67.0 in | Wt 230.0 lb

## 2015-03-28 DIAGNOSIS — I2510 Atherosclerotic heart disease of native coronary artery without angina pectoris: Secondary | ICD-10-CM | POA: Diagnosis not present

## 2015-03-28 DIAGNOSIS — I2583 Coronary atherosclerosis due to lipid rich plaque: Secondary | ICD-10-CM

## 2015-03-28 DIAGNOSIS — I739 Peripheral vascular disease, unspecified: Secondary | ICD-10-CM

## 2015-03-28 DIAGNOSIS — E785 Hyperlipidemia, unspecified: Secondary | ICD-10-CM

## 2015-03-28 DIAGNOSIS — I480 Paroxysmal atrial fibrillation: Secondary | ICD-10-CM | POA: Diagnosis not present

## 2015-03-28 DIAGNOSIS — G4730 Sleep apnea, unspecified: Secondary | ICD-10-CM | POA: Diagnosis not present

## 2015-03-28 MED ORDER — APIXABAN 5 MG PO TABS: 5.0000 mg | ORAL_TABLET | Freq: Two times a day (BID) | ORAL | Status: DC

## 2015-03-28 NOTE — Progress Notes (Signed)
03/28/2015 John Conley   09-16-1945  [id number]  Primary Physician Thayer Headings, MD Primary Cardiologist: Runell Gess MD Roseanne Reno   HPI:  The patient is a very pleasant, 70 year old, moderately overweight, married Caucasian male, father of 2 children (daughter [name] is an employee here at [employer],) who I last saw in the office 03/08/14.Marland Kitchen He has a history of CAD status post coronary artery bypass grafting x5 in 2006. He had a LIMA to his LAD, a left radial to a ramus intermedius, a vein to the distal LAD and a vein to diagonal branch. He had a Myoview performed in September 2013 which was low risk, normal LV function. He does have obstructive sleep apnea although he is noncompliant with his CPAP. He has also had left renal artery stenting in 2006 with followup Dopplers that were normal. He was complaining of some dizziness when he was seen a month ago. His meds were adjusted by his primary care doctor. He had an echo performed in our office that revealed normal LV systolic function and a Myoview that was normal as well. An event monitor showed nonsustained ventricular tachycardia and some PAF. Based on this, he was begun on low dose beta blocker and Eliquis because of his high CHADS2 VASc score of 4. Since I saw him almost one year ago he has remained clinically stable and denies any symptoms.   Current Outpatient Prescriptions  Medication Sig Dispense Refill  . amLODipine (NORVASC) 5 MG tablet Take 5 mg by mouth.    Marland Kitchen apixaban (ELIQUIS) 5 MG TABS tablet Take 1 tablet (5 mg total) by mouth 2 (two) times daily. 180 tablet 3  . aspirin 81 MG tablet Take 81 mg by mouth daily.    Marland Kitchen atenolol (TENORMIN) 25 MG tablet Take 0.5 tablets (12.5 mg total) by mouth daily. PATIENT NEEDS TO CONTACT OFFICE FOR ADDITIONAL REFILLS 2ND ATTEMPT 30 tablet 0  . canagliflozin (INVOKANA) 300 MG TABS tablet Take 300 mg by mouth daily before breakfast.    . fish oil-omega-3 fatty acids  1000 MG capsule Take 1 g by mouth daily.    . fluticasone (FLONASE) 50 MCG/ACT nasal spray Place 2 sprays into the nose.     . furosemide (LASIX) 40 MG tablet Take 1 tablet by mouth daily.    . Grape Seed 100 MG CAPS Take by mouth.    . irbesartan (AVAPRO) 300 MG tablet Take 300 mg by mouth.    . levothyroxine (SYNTHROID, LEVOTHROID) 50 MCG tablet     . magnesium oxide (MAG-OX) 400 MG tablet Take 400 mg by mouth daily.    . metFORMIN (GLUCOPHAGE) 850 MG tablet Take 850 mg by mouth 3 (three) times daily.     . Misc Natural Products (PROSTATE HEALTH PO) Take 1 tablet by mouth daily.    . Multiple Vitamin (MULTIVITAMIN) capsule Take 1 capsule by mouth daily.    Marland Kitchen NITROSTAT 0.4 MG SL tablet DISSOLVE 1 TABLET UNDER TONGUE EVERY 5 MINUTES UP TO 3 DOSES 25 tablet 2  . NON FORMULARY OTC supplement with co Q 10. Tru Heart 2 tabs daily    . potassium chloride SA (K-DUR,KLOR-CON) 20 MEQ tablet Take 1 tablet by mouth  daily 90 tablet 3  . rosuvastatin (CRESTOR) 20 MG tablet Take 20 mg by mouth daily.    Marland Kitchen terazosin (HYTRIN) 5 MG capsule Take 5 mg by mouth once.      No current facility-administered medications for this visit.  No Active Allergies  Social History   Social History  . Marital Status: Married    Spouse Name: N/A  . Number of Children: 2  . Years of Education: N/A   Occupational History  .  [employer]   Social History Main Topics  . Smoking status: Former Smoker    Quit date: 12/02/1988  . Smokeless tobacco: Not on file  . Alcohol Use: Not on file  . Drug Use: Not on file  . Sexual Activity: Not on file   Other Topics Concern  . Not on file   Social History Narrative   Married, 2 childen     Review of Systems: General: negative for chills, fever, night sweats or weight changes.  Cardiovascular: negative for chest pain, dyspnea on exertion, edema, orthopnea, palpitations, paroxysmal nocturnal dyspnea or shortness of breath Dermatological: negative for  rash Respiratory: negative for cough or wheezing Urologic: negative for hematuria Abdominal: negative for nausea, vomiting, diarrhea, bright red blood per rectum, melena, or hematemesis Neurologic: negative for visual changes, syncope, or dizziness All other systems reviewed and are otherwise negative except as noted above.    Blood pressure 130/54, pulse 52, height  (1.702 m), weight 230 lb (104.327 kg).  General appearance: alert and no distress Neck: no adenopathy, no carotid bruit, no JVD, supple, symmetrical, trachea midline and thyroid not enlarged, symmetric, no tenderness/mass/nodules Lungs: clear to auscultation bilaterally Heart: regular rate and rhythm, S1, S2 normal, no murmur, click, rub or gallop Extremities: extremities normal, atraumatic, no cyanosis or edema  EKG sinus bradycardia at 52 with septal Q waves. I personally reviewed this EKG.  ASSESSMENT AND PLAN:   CAD - CABG X 5 '06. Low risk Nuc 9/13 History of coronary artery disease status post bypass grafting in 2006 with a LIMA to his LAD, left radial to a ramus branch, vein to the distal LAD and diagonal branch. He reminded stress test performed 9/13 which was low risk and nonischemic with normal LV function. He denies chest pain or shortness of breath.  Sleep apnea- on C-pap History of obstructive sleep apnea. He currently does not use  Dyslipidemia History of dyslipidemia on Crestor followed by his PCP  PAF on Holter 3/14 History of PAF maintaining sinus rhythm on Eliquis   PVD - Lt RAS s/p stent 5/06. Dopplers OK 9/13 History of left renal artery stenting back in 2006. His most recent Dopplers performed 07/13/13 showed this to be widely patent.      Runell Gess MD FACP,[name],[name], [facility] 03/28/2015 1:46 PM

## 2015-03-28 NOTE — Assessment & Plan Note (Signed)
History of left renal artery stenting back in 2006. His most recent Dopplers performed 07/13/13 showed this to be widely patent.

## 2015-03-28 NOTE — Assessment & Plan Note (Signed)
History of PAF maintaining sinus rhythm on Eliquis 

## 2015-03-28 NOTE — Patient Instructions (Signed)

## 2015-03-28 NOTE — Assessment & Plan Note (Signed)
History of coronary artery disease status post bypass grafting in 2006 with a LIMA to his LAD, left radial to a ramus branch, vein to the distal LAD and diagonal branch. He reminded stress test performed 9/13 which was low risk and nonischemic with normal LV function. He denies chest pain or shortness of breath.

## 2015-03-28 NOTE — Assessment & Plan Note (Signed)
History of dyslipidemia on Crestor followed by his PCP

## 2015-03-28 NOTE — Assessment & Plan Note (Signed)
History of obstructive sleep apnea. He currently does not use

## 2015-04-13 DIAGNOSIS — I129 Hypertensive chronic kidney disease with stage 1 through stage 4 chronic kidney disease, or unspecified chronic kidney disease: Secondary | ICD-10-CM | POA: Diagnosis not present

## 2015-04-13 DIAGNOSIS — E1122 Type 2 diabetes mellitus with diabetic chronic kidney disease: Secondary | ICD-10-CM | POA: Diagnosis not present

## 2015-04-13 DIAGNOSIS — E785 Hyperlipidemia, unspecified: Secondary | ICD-10-CM | POA: Diagnosis not present

## 2015-04-19 DIAGNOSIS — M9905 Segmental and somatic dysfunction of pelvic region: Secondary | ICD-10-CM | POA: Diagnosis not present

## 2015-04-19 DIAGNOSIS — M545 Low back pain: Secondary | ICD-10-CM | POA: Diagnosis not present

## 2015-04-26 ENCOUNTER — Other Ambulatory Visit: Payer: Self-pay | Admitting: Cardiovascular Disease

## 2015-05-04 ENCOUNTER — Telehealth: Payer: Self-pay | Admitting: Cardiovascular Disease

## 2015-05-04 MED ORDER — ATENOLOL 25 MG PO TABS: 12.5000 mg | ORAL_TABLET | Freq: Every day | ORAL | Status: DC

## 2015-05-04 NOTE — Telephone Encounter (Signed)
New message    Patient calling      *STAT* If patient is at the pharmacy, call can be transferred to refill team.   1. Which medications need to be refilled? (please list name of each medication and dose if known) atenolol  25 mg   2. Which pharmacy/location (including street and city if local pharmacy) is medication to be sent to? [facility] [phone]   3. Do they need a 30 day or 90 day supply? 90 days supply

## 2015-05-04 NOTE — Telephone Encounter (Signed)
Rx(s) sent to pharmacy electronically.  

## 2015-05-15 DIAGNOSIS — E785 Hyperlipidemia, unspecified: Secondary | ICD-10-CM | POA: Diagnosis not present

## 2015-05-15 DIAGNOSIS — E1122 Type 2 diabetes mellitus with diabetic chronic kidney disease: Secondary | ICD-10-CM | POA: Diagnosis not present

## 2015-05-18 DIAGNOSIS — E039 Hypothyroidism, unspecified: Secondary | ICD-10-CM | POA: Diagnosis not present

## 2015-05-18 DIAGNOSIS — E1122 Type 2 diabetes mellitus with diabetic chronic kidney disease: Secondary | ICD-10-CM | POA: Diagnosis not present

## 2015-05-18 DIAGNOSIS — I129 Hypertensive chronic kidney disease with stage 1 through stage 4 chronic kidney disease, or unspecified chronic kidney disease: Secondary | ICD-10-CM | POA: Diagnosis not present

## 2015-05-18 DIAGNOSIS — E785 Hyperlipidemia, unspecified: Secondary | ICD-10-CM | POA: Diagnosis not present

## 2015-05-24 ENCOUNTER — Other Ambulatory Visit: Payer: Self-pay | Admitting: Cardiovascular Disease

## 2015-05-25 NOTE — Telephone Encounter (Signed)
REFILL 

## 2015-05-29 DIAGNOSIS — M9905 Segmental and somatic dysfunction of pelvic region: Secondary | ICD-10-CM | POA: Diagnosis not present

## 2015-05-29 DIAGNOSIS — M545 Low back pain: Secondary | ICD-10-CM | POA: Diagnosis not present

## 2015-06-01 DIAGNOSIS — E113293 Type 2 diabetes mellitus with mild nonproliferative diabetic retinopathy without macular edema, bilateral: Secondary | ICD-10-CM | POA: Diagnosis not present

## 2015-06-01 DIAGNOSIS — Z0100 Encounter for examination of eyes and vision without abnormal findings: Secondary | ICD-10-CM | POA: Diagnosis not present

## 2015-06-01 DIAGNOSIS — Z961 Presence of intraocular lens: Secondary | ICD-10-CM | POA: Diagnosis not present

## 2015-06-01 DIAGNOSIS — H26493 Other secondary cataract, bilateral: Secondary | ICD-10-CM | POA: Diagnosis not present

## 2015-06-05 ENCOUNTER — Telehealth: Payer: Self-pay | Admitting: Cardiovascular Disease

## 2015-06-05 NOTE — Telephone Encounter (Signed)
error 

## 2015-07-20 DIAGNOSIS — N401 Enlarged prostate with lower urinary tract symptoms: Secondary | ICD-10-CM | POA: Diagnosis not present

## 2015-07-20 DIAGNOSIS — N400 Enlarged prostate without lower urinary tract symptoms: Secondary | ICD-10-CM | POA: Diagnosis not present

## 2015-07-27 ENCOUNTER — Telehealth: Payer: Self-pay | Admitting: Pharmacist Clinician (PhC)/ Clinical Pharmacy Specialist

## 2015-07-27 MED ORDER — APIXABAN 5 MG PO TABS: 5.0000 mg | ORAL_TABLET | Freq: Two times a day (BID) | ORAL | Status: DC

## 2015-07-27 NOTE — Telephone Encounter (Signed)
Asking for samples.  Pt usually gets thru [facility].  $450 for 3 month supply.  3 weeks left at desk

## 2015-07-28 ENCOUNTER — Other Ambulatory Visit: Payer: Self-pay | Admitting: Cardiovascular Disease

## 2015-08-15 DIAGNOSIS — M9905 Segmental and somatic dysfunction of pelvic region: Secondary | ICD-10-CM | POA: Diagnosis not present

## 2015-08-15 DIAGNOSIS — M545 Low back pain: Secondary | ICD-10-CM | POA: Diagnosis not present

## 2015-09-13 DIAGNOSIS — Z1389 Encounter for screening for other disorder: Secondary | ICD-10-CM | POA: Diagnosis not present

## 2015-09-13 DIAGNOSIS — E538 Deficiency of other specified B group vitamins: Secondary | ICD-10-CM | POA: Diagnosis not present

## 2015-09-13 DIAGNOSIS — Z125 Encounter for screening for malignant neoplasm of prostate: Secondary | ICD-10-CM | POA: Diagnosis not present

## 2015-09-13 DIAGNOSIS — Z0000 Encounter for general adult medical examination without abnormal findings: Secondary | ICD-10-CM | POA: Diagnosis not present

## 2015-09-13 DIAGNOSIS — E1122 Type 2 diabetes mellitus with diabetic chronic kidney disease: Secondary | ICD-10-CM | POA: Diagnosis not present

## 2015-09-13 DIAGNOSIS — I509 Heart failure, unspecified: Secondary | ICD-10-CM | POA: Diagnosis not present

## 2015-09-13 DIAGNOSIS — Z1159 Encounter for screening for other viral diseases: Secondary | ICD-10-CM | POA: Diagnosis not present

## 2015-09-20 DIAGNOSIS — I4891 Unspecified atrial fibrillation: Secondary | ICD-10-CM | POA: Diagnosis not present

## 2015-09-20 DIAGNOSIS — E785 Hyperlipidemia, unspecified: Secondary | ICD-10-CM | POA: Diagnosis not present

## 2015-09-20 DIAGNOSIS — E1122 Type 2 diabetes mellitus with diabetic chronic kidney disease: Secondary | ICD-10-CM | POA: Diagnosis not present

## 2015-09-20 DIAGNOSIS — N182 Chronic kidney disease, stage 2 (mild): Secondary | ICD-10-CM | POA: Diagnosis not present

## 2015-09-20 DIAGNOSIS — I129 Hypertensive chronic kidney disease with stage 1 through stage 4 chronic kidney disease, or unspecified chronic kidney disease: Secondary | ICD-10-CM | POA: Diagnosis not present

## 2015-10-16 ENCOUNTER — Telehealth: Payer: Self-pay | Admitting: Pharmacist

## 2015-10-16 NOTE — Telephone Encounter (Signed)
LM that will leave samples at front desk for Eliquis.

## 2015-10-17 DIAGNOSIS — E1122 Type 2 diabetes mellitus with diabetic chronic kidney disease: Secondary | ICD-10-CM | POA: Diagnosis not present

## 2015-10-19 DIAGNOSIS — I129 Hypertensive chronic kidney disease with stage 1 through stage 4 chronic kidney disease, or unspecified chronic kidney disease: Secondary | ICD-10-CM | POA: Diagnosis not present

## 2015-10-19 DIAGNOSIS — E785 Hyperlipidemia, unspecified: Secondary | ICD-10-CM | POA: Diagnosis not present

## 2015-10-19 DIAGNOSIS — E1122 Type 2 diabetes mellitus with diabetic chronic kidney disease: Secondary | ICD-10-CM | POA: Diagnosis not present

## 2015-12-06 DIAGNOSIS — M9905 Segmental and somatic dysfunction of pelvic region: Secondary | ICD-10-CM | POA: Diagnosis not present

## 2015-12-06 DIAGNOSIS — M545 Low back pain: Secondary | ICD-10-CM | POA: Diagnosis not present

## 2015-12-07 DIAGNOSIS — M545 Low back pain: Secondary | ICD-10-CM | POA: Diagnosis not present

## 2015-12-07 DIAGNOSIS — M9905 Segmental and somatic dysfunction of pelvic region: Secondary | ICD-10-CM | POA: Diagnosis not present

## 2015-12-19 ENCOUNTER — Telehealth: Payer: Self-pay | Admitting: Pharmacist Clinician (PhC)/ Clinical Pharmacy Specialist

## 2015-12-19 MED ORDER — APIXABAN 5 MG PO TABS: 5.0000 mg | ORAL_TABLET | Freq: Two times a day (BID) | ORAL | 0 refills | Status: DC

## 2015-12-19 NOTE — Telephone Encounter (Signed)
3 weeks samples given

## 2015-12-25 DIAGNOSIS — M9905 Segmental and somatic dysfunction of pelvic region: Secondary | ICD-10-CM | POA: Diagnosis not present

## 2015-12-25 DIAGNOSIS — M545 Low back pain: Secondary | ICD-10-CM | POA: Diagnosis not present

## 2016-01-03 DIAGNOSIS — E039 Hypothyroidism, unspecified: Secondary | ICD-10-CM | POA: Diagnosis not present

## 2016-01-03 DIAGNOSIS — I129 Hypertensive chronic kidney disease with stage 1 through stage 4 chronic kidney disease, or unspecified chronic kidney disease: Secondary | ICD-10-CM | POA: Diagnosis not present

## 2016-01-03 DIAGNOSIS — E538 Deficiency of other specified B group vitamins: Secondary | ICD-10-CM | POA: Diagnosis not present

## 2016-01-03 DIAGNOSIS — E1122 Type 2 diabetes mellitus with diabetic chronic kidney disease: Secondary | ICD-10-CM | POA: Diagnosis not present

## 2016-03-06 ENCOUNTER — Other Ambulatory Visit: Payer: Self-pay | Admitting: Pharmacist

## 2016-03-06 MED ORDER — NITROGLYCERIN 0.4 MG SL SUBL: SUBLINGUAL_TABLET | SUBLINGUAL | 2 refills | Status: AC

## 2016-03-14 DIAGNOSIS — E538 Deficiency of other specified B group vitamins: Secondary | ICD-10-CM | POA: Diagnosis not present

## 2016-03-14 DIAGNOSIS — E1122 Type 2 diabetes mellitus with diabetic chronic kidney disease: Secondary | ICD-10-CM | POA: Diagnosis not present

## 2016-03-14 DIAGNOSIS — L989 Disorder of the skin and subcutaneous tissue, unspecified: Secondary | ICD-10-CM | POA: Diagnosis not present

## 2016-03-14 DIAGNOSIS — I129 Hypertensive chronic kidney disease with stage 1 through stage 4 chronic kidney disease, or unspecified chronic kidney disease: Secondary | ICD-10-CM | POA: Diagnosis not present

## 2016-03-14 DIAGNOSIS — N400 Enlarged prostate without lower urinary tract symptoms: Secondary | ICD-10-CM | POA: Diagnosis not present

## 2016-03-18 DIAGNOSIS — B372 Candidiasis of skin and nail: Secondary | ICD-10-CM | POA: Diagnosis not present

## 2016-03-21 DIAGNOSIS — N182 Chronic kidney disease, stage 2 (mild): Secondary | ICD-10-CM | POA: Diagnosis not present

## 2016-03-21 DIAGNOSIS — I4891 Unspecified atrial fibrillation: Secondary | ICD-10-CM | POA: Diagnosis not present

## 2016-03-21 DIAGNOSIS — I129 Hypertensive chronic kidney disease with stage 1 through stage 4 chronic kidney disease, or unspecified chronic kidney disease: Secondary | ICD-10-CM | POA: Diagnosis not present

## 2016-03-21 DIAGNOSIS — E1122 Type 2 diabetes mellitus with diabetic chronic kidney disease: Secondary | ICD-10-CM | POA: Diagnosis not present

## 2016-04-10 DIAGNOSIS — R197 Diarrhea, unspecified: Secondary | ICD-10-CM | POA: Diagnosis not present

## 2016-04-19 DIAGNOSIS — R197 Diarrhea, unspecified: Secondary | ICD-10-CM | POA: Diagnosis not present

## 2016-04-22 DIAGNOSIS — R197 Diarrhea, unspecified: Secondary | ICD-10-CM | POA: Diagnosis not present

## 2016-04-24 ENCOUNTER — Encounter: Payer: Self-pay | Admitting: Cardiovascular Disease

## 2016-04-24 ENCOUNTER — Ambulatory Visit (INDEPENDENT_AMBULATORY_CARE_PROVIDER_SITE_OTHER): Payer: Medicare Other | Admitting: Cardiovascular Disease

## 2016-04-24 VITALS — BP 173/66 | HR 48 | Ht 68.0 in | Wt 240.2 lb

## 2016-04-24 DIAGNOSIS — I2510 Atherosclerotic heart disease of native coronary artery without angina pectoris: Secondary | ICD-10-CM

## 2016-04-24 DIAGNOSIS — G4730 Sleep apnea, unspecified: Secondary | ICD-10-CM | POA: Diagnosis not present

## 2016-04-24 DIAGNOSIS — I480 Paroxysmal atrial fibrillation: Secondary | ICD-10-CM | POA: Diagnosis not present

## 2016-04-24 DIAGNOSIS — E785 Hyperlipidemia, unspecified: Secondary | ICD-10-CM

## 2016-04-24 DIAGNOSIS — I10 Essential (primary) hypertension: Secondary | ICD-10-CM | POA: Insufficient documentation

## 2016-04-24 DIAGNOSIS — I739 Peripheral vascular disease, unspecified: Secondary | ICD-10-CM | POA: Diagnosis not present

## 2016-04-24 NOTE — Assessment & Plan Note (Signed)
History of essential hypertension blood pressure is 173/66. Ordinarily he says a blood pressure when measured at home is in the 140 range systolic. He is on amlodipine, atenolol, Avapro. Continue current meds at current dosing.

## 2016-04-24 NOTE — Assessment & Plan Note (Signed)
History of left renal artery stent in 2006 Dopplers performed 07/13/13 revealing a widely patent stent.

## 2016-04-24 NOTE — Progress Notes (Signed)
04/24/2016 John Conley   1945-05-30  [id number]  Primary Physician Thayer HeadingsMACKENZIE,BRIAN, MD Primary Cardiologist: Runell GessJonathan J Siona Coulston MD Nicholes CalamityFACP, [name], [name], [place]  HPI:  The patient is a very pleasant, 71 year old, moderately overweight, married Caucasian male, father of 2 children (daughter [name] was an employee here and currently works for [employer] ) who I last saw in the office 03/28/15.Marland Kitchen. He has a history of CAD status post coronary artery bypass grafting x5 in 2006. He had a LIMA to his LAD, a left radial to a ramus intermedius, a vein to the distal LAD and a vein to diagonal branch. He had a Myoview performed in September 2013 which was low risk, normal LV function. He does have obstructive sleep apnea although he is noncompliant with his CPAP. He has also had left renal artery stenting in 2006 with followup Dopplers that were normal. He was complaining of some dizziness when he was seen a month ago. His meds were adjusted by his primary care doctor. He had an echo performed in our office that revealed normal LV systolic function and a Myoview that was normal as well. An event monitor showed nonsustained ventricular tachycardia and some PAF. Based on this, he was begun on low dose beta blocker and Eliquis because of his high CHADS2 VASc score of 4. Since I saw him almost one year ago he has remained clinically stable and denies any symptoms.   Current Outpatient Prescriptions  Medication Sig Dispense Refill  . amLODipine (NORVASC) 5 MG tablet Take 5 mg by mouth.    Marland Kitchen. apixaban (ELIQUIS) 5 MG TABS tablet Take 1 tablet (5 mg total) by mouth 2 (two) times daily. 42 tablet 0  . aspirin 81 MG tablet Take 81 mg by mouth daily.    Marland Kitchen. atenolol (TENORMIN) 25 MG tablet Take 0.5 tablets (12.5 mg total) by mouth daily. 45 tablet 3  . canagliflozin (INVOKANA) 300 MG TABS tablet Take 300 mg by mouth daily before breakfast.    . fish oil-omega-3 fatty acids 1000 MG capsule Take 1 g by mouth daily.    .  fluticasone (FLONASE) 50 MCG/ACT nasal spray Place 2 sprays into the nose.     . Grape Seed 100 MG CAPS Take by mouth.    . irbesartan (AVAPRO) 300 MG tablet Take 300 mg by mouth.    . levothyroxine (SYNTHROID, LEVOTHROID) 50 MCG tablet     . Misc Natural Products (PROSTATE HEALTH PO) Take 1 tablet by mouth daily.    . Multiple Vitamin (MULTIVITAMIN) capsule Take 1 capsule by mouth daily.    . nitroGLYCERIN (NITROSTAT) 0.4 MG SL tablet DISSOLVE 1 TABLET UNDER TONGUE EVERY 5 MINUTES UP TO 3 DOSES 25 tablet 2  . NON FORMULARY OTC supplement with co Q 10. Tru Heart 2 tabs daily    . potassium chloride SA (K-DUR,KLOR-CON) 20 MEQ tablet Take 1 tablet (20 mEq total) by mouth daily. 90 tablet 3  . terazosin (HYTRIN) 5 MG capsule Take 5 mg by mouth once.      No current facility-administered medications for this visit.     No Active Allergies  Social History   Social History  . Marital status: Married    Spouse name: N/A  . Number of children: 2  . Years of education: N/A   Occupational History  .  [employer]   Social History Main Topics  . Smoking status: Former Smoker    Quit date: 12/02/1988  . Smokeless tobacco: Never Used  .  Alcohol use Not on file  . Drug use: Unknown  . Sexual activity: Not on file   Other Topics Concern  . Not on file   Social History Narrative   Married, 2 childen     Review of Systems: General: negative for chills, fever, night sweats or weight changes.  Cardiovascular: negative for chest pain, dyspnea on exertion, edema, orthopnea, palpitations, paroxysmal nocturnal dyspnea or shortness of breath Dermatological: negative for rash Respiratory: negative for cough or wheezing Urologic: negative for hematuria Abdominal: negative for nausea, vomiting, diarrhea, bright red blood per rectum, melena, or hematemesis Neurologic: negative for visual changes, syncope, or dizziness All other systems reviewed and are otherwise negative except as noted  above.    Blood pressure (!) 173/66, pulse (!) 48, height 5\' 8"  (1.727 m), weight 240 lb 3.2 oz (109 kg).  General appearance: alert and no distress Neck: no adenopathy, no carotid bruit, no JVD, supple, symmetrical, trachea midline and thyroid not enlarged, symmetric, no tenderness/mass/nodules Lungs: clear to auscultation bilaterally Heart: regular rate and rhythm, S1, S2 normal, no murmur, click, rub or gallop Extremities: extremities normal, atraumatic, no cyanosis or edema  EKG sinus bradycardia 48 with septal Q waves. I personally reviewed this EKG  ASSESSMENT AND PLAN:   CAD - CABG X 5 '06. Low risk Nuc 9/13 History of coronary artery disease status post coronary artery bypass grafting X 5 2006. He had a LIMA to his LAD, left radial to ramus intermedius, vein to the distal LAD and a vein to diagonal branch. His last Myoview performed September 2013 was low risk and nonischemic he denies chest pain or shortness of breath.  Sleep apnea- on C-pap History of obstructive sleep apnea currently not wearing CPAP  Dyslipidemia History of dyslipidemia currently not on statin therapy. This is followed by his PCP  PAF on Holter 3/14 History of PAF found on Holter monitoring currently in sinus rhythm on Eliquis oral anticoagulation.  PVD - Lt RAS s/p stent 5/06. Dopplers OK 9/13 History of left renal artery stent in 2006 Dopplers performed 07/13/13 revealing a widely patent stent.  Essential hypertension History of essential hypertension blood pressure is 173/66. Ordinarily he says a blood pressure when measured at home is in the 140 range systolic. He is on amlodipine, atenolol, Avapro. Continue current meds at current dosing.      Runell Gess MD FACP,[name],[name], [facility] 04/24/2016 9:12 AM

## 2016-04-24 NOTE — Patient Instructions (Addendum)
Medication Instructions: Your physician recommends that you continue on your current medications as directed. Please refer to the Current Medication list given to you today.   Labwork: I will request labwork form Dr. Thea SilversmithMackenzie.   Follow-Up: Your physician wants you to follow-up in: 1 year with Dr. Allyson SabalBerry. You will receive a reminder letter in the mail two months in advance. If you don't receive a letter, please call our office to schedule the follow-up appointment.  If you need a refill on your cardiac medications before your next appointment, please call your pharmacy.

## 2016-04-24 NOTE — Assessment & Plan Note (Signed)
History of PAF found on Holter monitoring currently in sinus rhythm on Eliquis oral anticoagulation.

## 2016-04-24 NOTE — Assessment & Plan Note (Signed)
History of coronary artery disease status post coronary artery bypass grafting X 5 2006. He had a LIMA to his LAD, left radial to ramus intermedius, vein to the distal LAD and a vein to diagonal branch. His last Myoview performed September 2013 was low risk and nonischemic he denies chest pain or shortness of breath.

## 2016-04-24 NOTE — Assessment & Plan Note (Addendum)
History of dyslipidemia currently not on statin therapy. This is followed by his PCP

## 2016-04-24 NOTE — Assessment & Plan Note (Signed)
History of obstructive sleep apnea currently not wearing CPAP. °

## 2016-05-13 DIAGNOSIS — E1122 Type 2 diabetes mellitus with diabetic chronic kidney disease: Secondary | ICD-10-CM | POA: Diagnosis not present

## 2016-05-20 DIAGNOSIS — R194 Change in bowel habit: Secondary | ICD-10-CM | POA: Diagnosis not present

## 2016-05-20 DIAGNOSIS — T887XXA Unspecified adverse effect of drug or medicament, initial encounter: Secondary | ICD-10-CM | POA: Diagnosis not present

## 2016-05-20 DIAGNOSIS — Z86010 Personal history of colonic polyps: Secondary | ICD-10-CM | POA: Diagnosis not present

## 2016-06-03 DIAGNOSIS — H5213 Myopia, bilateral: Secondary | ICD-10-CM | POA: Diagnosis not present

## 2016-06-03 DIAGNOSIS — H26493 Other secondary cataract, bilateral: Secondary | ICD-10-CM | POA: Diagnosis not present

## 2016-06-03 DIAGNOSIS — E113292 Type 2 diabetes mellitus with mild nonproliferative diabetic retinopathy without macular edema, left eye: Secondary | ICD-10-CM | POA: Diagnosis not present

## 2016-06-03 LAB — HM DIABETES EYE EXAM

## 2016-06-12 DIAGNOSIS — J019 Acute sinusitis, unspecified: Secondary | ICD-10-CM | POA: Diagnosis not present

## 2016-06-12 DIAGNOSIS — L409 Psoriasis, unspecified: Secondary | ICD-10-CM | POA: Diagnosis not present

## 2016-06-13 ENCOUNTER — Other Ambulatory Visit: Payer: Self-pay

## 2016-06-13 MED ORDER — ATENOLOL 25 MG PO TABS: 12.5000 mg | ORAL_TABLET | Freq: Every day | ORAL | 3 refills | Status: AC

## 2016-06-13 MED ORDER — POTASSIUM CHLORIDE CRYS ER 20 MEQ PO TBCR: 20.0000 meq | EXTENDED_RELEASE_TABLET | Freq: Every day | ORAL | 3 refills | Status: DC

## 2016-07-23 DIAGNOSIS — R3915 Urgency of urination: Secondary | ICD-10-CM | POA: Diagnosis not present

## 2016-07-23 DIAGNOSIS — N401 Enlarged prostate with lower urinary tract symptoms: Secondary | ICD-10-CM | POA: Diagnosis not present

## 2016-07-23 DIAGNOSIS — R350 Frequency of micturition: Secondary | ICD-10-CM | POA: Diagnosis not present

## 2016-07-23 DIAGNOSIS — E119 Type 2 diabetes mellitus without complications: Secondary | ICD-10-CM | POA: Diagnosis not present

## 2016-07-28 DIAGNOSIS — J0110 Acute frontal sinusitis, unspecified: Secondary | ICD-10-CM | POA: Diagnosis not present

## 2016-08-06 DIAGNOSIS — J0111 Acute recurrent frontal sinusitis: Secondary | ICD-10-CM | POA: Diagnosis not present

## 2016-08-09 ENCOUNTER — Other Ambulatory Visit: Payer: Self-pay | Admitting: Pharmacist

## 2016-08-09 MED ORDER — APIXABAN 5 MG PO TABS: 5.0000 mg | ORAL_TABLET | Freq: Two times a day (BID) | ORAL | 0 refills | Status: DC

## 2016-08-15 ENCOUNTER — Other Ambulatory Visit: Payer: Self-pay | Admitting: Pharmacist

## 2016-08-15 MED ORDER — APIXABAN 5 MG PO TABS: 5.0000 mg | ORAL_TABLET | Freq: Two times a day (BID) | ORAL | 0 refills | Status: AC

## 2016-09-11 DIAGNOSIS — E039 Hypothyroidism, unspecified: Secondary | ICD-10-CM | POA: Diagnosis not present

## 2016-09-11 DIAGNOSIS — N400 Enlarged prostate without lower urinary tract symptoms: Secondary | ICD-10-CM | POA: Diagnosis not present

## 2016-09-11 DIAGNOSIS — E785 Hyperlipidemia, unspecified: Secondary | ICD-10-CM | POA: Diagnosis not present

## 2016-09-11 DIAGNOSIS — Z0000 Encounter for general adult medical examination without abnormal findings: Secondary | ICD-10-CM | POA: Diagnosis not present

## 2016-09-11 DIAGNOSIS — E1122 Type 2 diabetes mellitus with diabetic chronic kidney disease: Secondary | ICD-10-CM | POA: Diagnosis not present

## 2016-09-17 DIAGNOSIS — E039 Hypothyroidism, unspecified: Secondary | ICD-10-CM | POA: Diagnosis not present

## 2016-09-17 DIAGNOSIS — E785 Hyperlipidemia, unspecified: Secondary | ICD-10-CM | POA: Diagnosis not present

## 2016-09-17 DIAGNOSIS — E1122 Type 2 diabetes mellitus with diabetic chronic kidney disease: Secondary | ICD-10-CM | POA: Diagnosis not present

## 2016-09-17 DIAGNOSIS — I129 Hypertensive chronic kidney disease with stage 1 through stage 4 chronic kidney disease, or unspecified chronic kidney disease: Secondary | ICD-10-CM | POA: Diagnosis not present

## 2016-09-18 DIAGNOSIS — N182 Chronic kidney disease, stage 2 (mild): Secondary | ICD-10-CM | POA: Diagnosis not present

## 2016-09-18 DIAGNOSIS — E1122 Type 2 diabetes mellitus with diabetic chronic kidney disease: Secondary | ICD-10-CM | POA: Diagnosis not present

## 2016-09-18 DIAGNOSIS — I4891 Unspecified atrial fibrillation: Secondary | ICD-10-CM | POA: Diagnosis not present

## 2016-09-18 DIAGNOSIS — Z0000 Encounter for general adult medical examination without abnormal findings: Secondary | ICD-10-CM | POA: Diagnosis not present

## 2016-09-23 DIAGNOSIS — M25561 Pain in right knee: Secondary | ICD-10-CM | POA: Diagnosis not present

## 2016-09-23 DIAGNOSIS — M67461 Ganglion, right knee: Secondary | ICD-10-CM | POA: Diagnosis not present

## 2016-11-08 ENCOUNTER — Encounter: Payer: Self-pay | Admitting: Cardiovascular Disease

## 2016-11-08 ENCOUNTER — Ambulatory Visit (INDEPENDENT_AMBULATORY_CARE_PROVIDER_SITE_OTHER): Payer: Medicare Other | Admitting: Cardiovascular Disease

## 2016-11-08 VITALS — BP 156/62 | HR 52 | Ht 68.0 in | Wt 242.0 lb

## 2016-11-08 DIAGNOSIS — I10 Essential (primary) hypertension: Secondary | ICD-10-CM | POA: Diagnosis not present

## 2016-11-08 DIAGNOSIS — G4730 Sleep apnea, unspecified: Secondary | ICD-10-CM

## 2016-11-08 DIAGNOSIS — I739 Peripheral vascular disease, unspecified: Secondary | ICD-10-CM | POA: Diagnosis not present

## 2016-11-08 DIAGNOSIS — I480 Paroxysmal atrial fibrillation: Secondary | ICD-10-CM

## 2016-11-08 DIAGNOSIS — R079 Chest pain, unspecified: Secondary | ICD-10-CM

## 2016-11-08 DIAGNOSIS — R0609 Other forms of dyspnea: Secondary | ICD-10-CM | POA: Diagnosis not present

## 2016-11-08 DIAGNOSIS — E785 Hyperlipidemia, unspecified: Secondary | ICD-10-CM

## 2016-11-08 NOTE — Assessment & Plan Note (Addendum)
History of CAD status post coronary artery bypass grafting 5 in 2006. LIMA to his LAD, left radial to ramus branch, vein to the distal LAD, vein to diagonal branch is less Myoview performed 07/16/13 was nonischemic. He does complain of occasional chest pain and dyspnea. I am very get a 2-D echo and a pharmacologic Myoview stress test to further evaluate.

## 2016-11-08 NOTE — Assessment & Plan Note (Signed)
History of paroxysmal atrial fibrillation currently on Eliquis oral anticoagulation.

## 2016-11-08 NOTE — Progress Notes (Signed)
11/08/2016 KHI MCMILLEN   1945-03-06  [id number]  Primary Physician Thayer Headings, MD Primary Cardiologist: Runell Gess MD [name], [place]  HPI:  John Conley is a 71 y.o. male moderately overweight, married Caucasian male, father of 2 children (daughter [name] was an employee here and currently works for [employer] ) who I last saw in the office 04/24/16.Marland Kitchen He has a history of CAD status post coronary artery bypass grafting x5 in 2006. He had a LIMA to his LAD, a left radial to a ramus intermedius, a vein to the distal LAD and a vein to diagonal branch. He had a Myoview performed in September 2013 which was low risk, normal LV function. He does have obstructive sleep apnea although he is noncompliant with his CPAP. He has also had left renal artery stenting in 2006 with followup Dopplers that were normal. He was complaining of some dizziness when he was seen a month ago. His meds were adjusted by his primary care doctor. He had an echo performed in our office that revealed normal LV systolic function and a Myoview that was normal as well. An event monitor showed nonsustained ventricular tachycardia and some PAF. Based on this, he was begun on low dose beta blocker and Eliquis because of his high CHADS2 VASc score of 4. Since I saw him 6 months ago continues to complain of some episodic dizziness. He also complains of symptoms compatible with claudication and dyspnea with occasional chest pain. He did have a negative Myoview in 2015 and a fairly normal 2-D echo.   Current Meds  Medication Sig  . amLODipine (NORVASC) 5 MG tablet Take 5 mg by mouth.  Marland Kitchen apixaban (ELIQUIS) 5 MG TABS tablet Take 1 tablet (5 mg total) by mouth 2 (two) times daily.  Marland Kitchen aspirin 81 MG tablet Take 81 mg by mouth daily.  Marland Kitchen atenolol (TENORMIN) 25 MG tablet Take 0.5 tablets (12.5 mg total) by mouth daily.  . canagliflozin (INVOKANA) 300 MG TABS tablet Take 300 mg by mouth daily before breakfast.  .  fish oil-omega-3 fatty acids 1000 MG capsule Take 1 g by mouth daily.  . fluticasone (FLONASE) 50 MCG/ACT nasal spray Place 2 sprays into the nose.   . Grape Seed 100 MG CAPS Take by mouth.  . irbesartan (AVAPRO) 300 MG tablet Take 300 mg by mouth.  . levothyroxine (SYNTHROID, LEVOTHROID) 50 MCG tablet   . Misc Natural Products (PROSTATE HEALTH PO) Take 1 tablet by mouth daily.  . Multiple Vitamin (MULTIVITAMIN) capsule Take 1 capsule by mouth daily.  . nitroGLYCERIN (NITROSTAT) 0.4 MG SL tablet DISSOLVE 1 TABLET UNDER TONGUE EVERY 5 MINUTES UP TO 3 DOSES  . NON FORMULARY OTC supplement with co Q 10. Tru Heart 2 tabs daily  . potassium chloride SA (K-DUR,KLOR-CON) 20 MEQ tablet Take 1 tablet (20 mEq total) by mouth daily.  Marland Kitchen terazosin (HYTRIN) 5 MG capsule Take 5 mg by mouth once.      Allergies  Allergen Reactions  . Metoprolol Other (See Comments)    Nightmares Nightmares    Social History   Social History  . Marital status: Married    Spouse name: N/A  . Number of children: 2  . Years of education: N/A   Occupational History  .  [employer]   Social History Main Topics  . Smoking status: Former Smoker    Quit date: 12/02/1988  . Smokeless tobacco: Never Used  . Alcohol use Not on file  .  Drug use: Unknown  . Sexual activity: Not on file   Other Topics Concern  . Not on file   Social History Narrative   Married, 2 childen     Review of Systems: General: negative for chills, fever, night sweats or weight changes.  Cardiovascular: negative for chest pain, dyspnea on exertion, edema, orthopnea, palpitations, paroxysmal nocturnal dyspnea or shortness of breath Dermatological: negative for rash Respiratory: negative for cough or wheezing Urologic: negative for hematuria Abdominal: negative for nausea, vomiting, diarrhea, bright red blood per rectum, melena, or hematemesis Neurologic: negative for visual changes, syncope, or dizziness All other systems reviewed  and are otherwise negative except as noted above.    Blood pressure (!) 156/62, pulse (!) 52, height  (1.727 m), weight 242 lb (109.8 kg).  General appearance: alert and no distress Neck: no adenopathy, no carotid bruit, no JVD, supple, symmetrical, trachea midline and thyroid not enlarged, symmetric, no tenderness/mass/nodules Lungs: clear to auscultation bilaterally Heart: regular rate and rhythm, S1, S2 normal, no murmur, click, rub or gallop Extremities: extremities normal, atraumatic, no cyanosis or edema Pulses: Diminished pedal pulses Skin: Skin color, texture, turgor normal. No rashes or lesions Neurologic: Alert and oriented X 3, normal strength and tone. Normal symmetric reflexes. Normal coordination and gait  EKG not performed today  ASSESSMENT AND PLAN:   CAD - CABG X 5 '06. Low risk Nuc 9/13 History of CAD status post coronary artery bypass grafting 5 in 2006. LIMA to his LAD, left radial to ramus branch, vein to the distal LAD, vein to diagonal branch is less Myoview performed 07/16/13 was nonischemic. He does complain of occasional chest pain and dyspnea. I am very get a 2-D echo and a pharmacologic Myoview stress test to further evaluate.  Sleep apnea- on C-pap History of obstructive sleep apnea noncompliant with CPAP  Dyslipidemia History of dyslipidemia on statin therapy followed by his PCP  PAF on Holter 3/14 History of paroxysmal atrial fibrillation currently on Eliquis oral anticoagulation.  PVD - Lt RAS s/p stent 5/06. Dopplers OK 9/13 History of peripheral arterial disease status post left renal artery stenting in 2006. Renal Dopplers performed 07/13/13 revealed the stent to be widely patent.  Essential hypertension History of essential hypertension with blood pressure measured today 156/62. He is on amlodipine, atenolol and Avapro. Continue current meds at current dosing.      Runell Gess MD FACP,[name],[name], [facility] 11/08/2016 3:21 PM

## 2016-11-08 NOTE — Assessment & Plan Note (Signed)
History of peripheral arterial disease status post left renal artery stenting in 2006. Renal Dopplers performed 07/13/13 revealed the stent to be widely patent.

## 2016-11-08 NOTE — Assessment & Plan Note (Signed)
History of dyslipidemia on statin therapy followed by his PCP 

## 2016-11-08 NOTE — Assessment & Plan Note (Signed)
History of essential hypertension with blood pressure measured today 156/62. He is on amlodipine, atenolol and Avapro. Continue current meds at current dosing.

## 2016-11-08 NOTE — Patient Instructions (Signed)
Medication Instructions: Your physician recommends that you continue on your current medications as directed. Please refer to the Current Medication list given to you today.   Testing/Procedures: Your physician has requested that you have a lexiscan myoview. For further information please visit [email or web address] Please follow instruction sheet, as given.  Your physician has requested that you have an echocardiogram. Echocardiography is a painless test that uses sound waves to create images of your heart. It provides your doctor with information about the size and shape of your heart and how well your heart's chambers and valves are working. This procedure takes approximately one hour. There are no restrictions for this procedure.  Your physician has requested that you have a lower extremity arterial duplex. During this test, ultrasound is used to evaluate arterial blood flow in the legs. Allow one hour for this exam. There are no restrictions or special instructions.  Your physician has requested that you have an ankle brachial index (ABI). During this test an ultrasound and blood pressure cuff are used to evaluate the arteries that supply the arms and legs with blood. Allow thirty minutes for this exam. There are no restrictions or special instructions.  Follow-Up: Your physician recommends that you schedule a follow-up appointment after testing with Dr. Allyson Sabal.  If you need a refill on your cardiac medications before your next appointment, please call your pharmacy.

## 2016-11-08 NOTE — Assessment & Plan Note (Signed)
History of obstructive sleep apnea noncompliant with CPAP

## 2016-11-18 ENCOUNTER — Other Ambulatory Visit: Payer: Self-pay

## 2016-11-18 ENCOUNTER — Ambulatory Visit (HOSPITAL_COMMUNITY): Payer: Medicare Other | Attending: Cardiovascular Disease

## 2016-11-18 DIAGNOSIS — E669 Obesity, unspecified: Secondary | ICD-10-CM | POA: Insufficient documentation

## 2016-11-18 DIAGNOSIS — Z6836 Body mass index (BMI) 36.0-36.9, adult: Secondary | ICD-10-CM | POA: Diagnosis not present

## 2016-11-18 DIAGNOSIS — R0609 Other forms of dyspnea: Secondary | ICD-10-CM | POA: Diagnosis not present

## 2016-11-18 DIAGNOSIS — I4891 Unspecified atrial fibrillation: Secondary | ICD-10-CM | POA: Diagnosis not present

## 2016-11-18 DIAGNOSIS — I2510 Atherosclerotic heart disease of native coronary artery without angina pectoris: Secondary | ICD-10-CM | POA: Diagnosis not present

## 2016-11-18 DIAGNOSIS — R079 Chest pain, unspecified: Secondary | ICD-10-CM

## 2016-11-18 DIAGNOSIS — E785 Hyperlipidemia, unspecified: Secondary | ICD-10-CM | POA: Insufficient documentation

## 2016-11-18 DIAGNOSIS — I071 Rheumatic tricuspid insufficiency: Secondary | ICD-10-CM | POA: Insufficient documentation

## 2016-11-18 DIAGNOSIS — I119 Hypertensive heart disease without heart failure: Secondary | ICD-10-CM | POA: Insufficient documentation

## 2016-11-18 DIAGNOSIS — Z951 Presence of aortocoronary bypass graft: Secondary | ICD-10-CM | POA: Diagnosis not present

## 2016-11-19 ENCOUNTER — Telehealth (HOSPITAL_COMMUNITY): Payer: Self-pay

## 2016-11-19 NOTE — Telephone Encounter (Signed)
Encounter complete. 

## 2016-11-21 ENCOUNTER — Ambulatory Visit (HOSPITAL_COMMUNITY)
Admission: RE | Admit: 2016-11-21 | Discharge: 2016-11-21 | Disposition: A | Discharge status: Home / Self Care | Payer: Medicare Other | Source: Ambulatory Visit | Attending: Cardiology | Admitting: Cardiology

## 2016-11-21 DIAGNOSIS — Z951 Presence of aortocoronary bypass graft: Secondary | ICD-10-CM | POA: Diagnosis not present

## 2016-11-21 DIAGNOSIS — R42 Dizziness and giddiness: Secondary | ICD-10-CM | POA: Insufficient documentation

## 2016-11-21 DIAGNOSIS — E039 Hypothyroidism, unspecified: Secondary | ICD-10-CM | POA: Diagnosis not present

## 2016-11-21 DIAGNOSIS — I2510 Atherosclerotic heart disease of native coronary artery without angina pectoris: Secondary | ICD-10-CM | POA: Diagnosis not present

## 2016-11-21 DIAGNOSIS — Z6836 Body mass index (BMI) 36.0-36.9, adult: Secondary | ICD-10-CM | POA: Insufficient documentation

## 2016-11-21 DIAGNOSIS — R0609 Other forms of dyspnea: Secondary | ICD-10-CM

## 2016-11-21 DIAGNOSIS — Z87891 Personal history of nicotine dependence: Secondary | ICD-10-CM | POA: Diagnosis not present

## 2016-11-21 DIAGNOSIS — R9439 Abnormal result of other cardiovascular function study: Secondary | ICD-10-CM | POA: Insufficient documentation

## 2016-11-21 DIAGNOSIS — E669 Obesity, unspecified: Secondary | ICD-10-CM | POA: Diagnosis not present

## 2016-11-21 DIAGNOSIS — E119 Type 2 diabetes mellitus without complications: Secondary | ICD-10-CM | POA: Diagnosis not present

## 2016-11-21 DIAGNOSIS — R079 Chest pain, unspecified: Secondary | ICD-10-CM | POA: Diagnosis not present

## 2016-11-21 DIAGNOSIS — I701 Atherosclerosis of renal artery: Secondary | ICD-10-CM | POA: Insufficient documentation

## 2016-11-21 DIAGNOSIS — I10 Essential (primary) hypertension: Secondary | ICD-10-CM | POA: Insufficient documentation

## 2016-11-21 LAB — MYOCARDIAL PERFUSION IMAGING
CHL CUP NUCLEAR SDS: 2
CHL CUP NUCLEAR SSS: 2
CSEPPHR: 64 {beats}/min
Rest HR: 49 {beats}/min
SRS: 0
TID: 1.36

## 2016-11-21 MED ORDER — TECHNETIUM TC 99M TETROFOSMIN IV KIT
10.6000 | PACK | Freq: Once | INTRAVENOUS | Status: AC | PRN
  Administered 2016-11-21: 10.6 via INTRAVENOUS
  Filled 2016-11-21: qty 11

## 2016-11-21 MED ORDER — REGADENOSON 0.4 MG/5ML IV SOLN
0.4000 mg | Freq: Once | INTRAVENOUS | Status: AC
  Administered 2016-11-21: 0.4 mg via INTRAVENOUS

## 2016-11-21 MED ORDER — TECHNETIUM TC 99M TETROFOSMIN IV KIT
31.8000 | PACK | Freq: Once | INTRAVENOUS | Status: AC | PRN
  Administered 2016-11-21: 31.8 via INTRAVENOUS
  Filled 2016-11-21: qty 32

## 2016-12-04 ENCOUNTER — Ambulatory Visit (HOSPITAL_COMMUNITY)
Admission: RE | Admit: 2016-12-04 | Discharge: 2016-12-04 | Disposition: A | Discharge status: Home / Self Care | Payer: Medicare Other | Source: Ambulatory Visit | Attending: Cardiology | Admitting: Cardiology

## 2016-12-04 DIAGNOSIS — I739 Peripheral vascular disease, unspecified: Secondary | ICD-10-CM

## 2016-12-13 ENCOUNTER — Encounter: Payer: Self-pay | Admitting: Cardiovascular Disease

## 2016-12-13 ENCOUNTER — Ambulatory Visit: Payer: Medicare Other | Admitting: Cardiovascular Disease

## 2016-12-13 VITALS — BP 126/62 | HR 48 | Ht 68.0 in | Wt 240.0 lb

## 2016-12-13 DIAGNOSIS — I2510 Atherosclerotic heart disease of native coronary artery without angina pectoris: Secondary | ICD-10-CM

## 2016-12-13 DIAGNOSIS — I10 Essential (primary) hypertension: Secondary | ICD-10-CM

## 2016-12-13 NOTE — Patient Instructions (Signed)

## 2016-12-13 NOTE — Progress Notes (Signed)
History of CAD status post bypass grafting 5 in 2006. Urine negative Myoview in 2015. He has complained of some stable exercise-induced chest pain with recent Myoview that was read as intermediate in severity with 2 small defects in the basal anterior and inferior walls. The symptoms did not change in frequency or severity. 2-D echo was essentially normal except for moderate pulmonary hypertension. At this point, I'm going to follow him conservatively. I also obtain lower extremity arterial Doppler studies in him because of complaints of claudication which were essentially normal.  Runell GessJonathan J. Sheylin Scharnhorst, M.D., FACP, [facility] HealthFACC, Earl LagosFAHA, [facility]SC[facility] [address] [place], [place]  [id number]  [phone] 12/13/2016 11:49 AM

## 2016-12-13 NOTE — Assessment & Plan Note (Signed)
History of CAD status post bypass grafting 5 in 2006. Urine negative Myoview in 2015. He has complained of some stable exercise-induced chest pain with recent Myoview that was read as intermediate in severity with 2 small defects in the basal anterior and inferior walls. The symptoms did not change in frequency or severity. 2-D echo was essentially normal except for moderate pulmonary hypertension. At this point, I'm going to follow him conservatively

## 2017-02-07 DIAGNOSIS — E039 Hypothyroidism, unspecified: Secondary | ICD-10-CM | POA: Diagnosis not present

## 2017-02-07 DIAGNOSIS — E1122 Type 2 diabetes mellitus with diabetic chronic kidney disease: Secondary | ICD-10-CM | POA: Diagnosis not present

## 2017-02-07 DIAGNOSIS — E785 Hyperlipidemia, unspecified: Secondary | ICD-10-CM | POA: Diagnosis not present

## 2017-02-07 DIAGNOSIS — I129 Hypertensive chronic kidney disease with stage 1 through stage 4 chronic kidney disease, or unspecified chronic kidney disease: Secondary | ICD-10-CM | POA: Diagnosis not present

## 2017-03-18 DIAGNOSIS — E039 Hypothyroidism, unspecified: Secondary | ICD-10-CM | POA: Diagnosis not present

## 2017-03-18 DIAGNOSIS — E538 Deficiency of other specified B group vitamins: Secondary | ICD-10-CM | POA: Diagnosis not present

## 2017-03-18 DIAGNOSIS — E1122 Type 2 diabetes mellitus with diabetic chronic kidney disease: Secondary | ICD-10-CM | POA: Diagnosis not present

## 2017-03-18 DIAGNOSIS — E785 Hyperlipidemia, unspecified: Secondary | ICD-10-CM | POA: Diagnosis not present

## 2017-03-24 DIAGNOSIS — E1122 Type 2 diabetes mellitus with diabetic chronic kidney disease: Secondary | ICD-10-CM | POA: Diagnosis not present

## 2017-04-30 ENCOUNTER — Telehealth: Payer: Self-pay | Admitting: General Practice

## 2017-04-30 DIAGNOSIS — M545 Low back pain: Secondary | ICD-10-CM | POA: Diagnosis not present

## 2017-04-30 DIAGNOSIS — M5136 Other intervertebral disc degeneration, lumbar region: Secondary | ICD-10-CM | POA: Diagnosis not present

## 2017-04-30 DIAGNOSIS — M5416 Radiculopathy, lumbar region: Secondary | ICD-10-CM | POA: Diagnosis not present

## 2017-04-30 NOTE — Telephone Encounter (Signed)
Copied from CRM [phone]. Topic: Quick Communication - See Telephone Encounter >> Apr 30, 2017  9:54 AM Ninfa MeekerPoole, Bridgett H wrote: CRM for notification. See Telephone encounter for: 04/30/17.  Received NP email called to set up appt left voicemail message for pt to call back

## 2017-05-07 ENCOUNTER — Telehealth: Payer: Self-pay

## 2017-05-07 DIAGNOSIS — Z7409 Other reduced mobility: Secondary | ICD-10-CM | POA: Diagnosis not present

## 2017-05-07 DIAGNOSIS — M5136 Other intervertebral disc degeneration, lumbar region: Secondary | ICD-10-CM | POA: Diagnosis not present

## 2017-05-07 NOTE — Telephone Encounter (Signed)
   [facility] Pre-operative Risk Assessment    Request for surgical clearance:  1. What type of surgery is being performed? Colonoscopy   2. When is this surgery scheduled? 06/25/2017   3. What type of clearance is required (medical clearance vs. Pharmacy clearance to hold med vs. Both)? Both  4. Are there any medications that need to be held prior to surgery and how long? Can patient hold Eliquis night before and morning of procedure   5. Practice name and name of physician performing surgery? Dr. Herbie Baltimore Buccini with [facility]   6. What is your office phone and fax number? Phone: [phone]; Fax [phone]   7. Anesthesia type (None, local, MAC, general) ? Not indicated on form   John Conley 05/07/2017, 6:13 PM  _________________________________________________________________   (provider comments below)

## 2017-05-08 DIAGNOSIS — Z09 Encounter for follow-up examination after completed treatment for conditions other than malignant neoplasm: Secondary | ICD-10-CM | POA: Diagnosis not present

## 2017-05-08 DIAGNOSIS — N401 Enlarged prostate with lower urinary tract symptoms: Secondary | ICD-10-CM | POA: Diagnosis not present

## 2017-05-09 DIAGNOSIS — M5136 Other intervertebral disc degeneration, lumbar region: Secondary | ICD-10-CM | POA: Diagnosis not present

## 2017-05-09 DIAGNOSIS — Z7409 Other reduced mobility: Secondary | ICD-10-CM | POA: Diagnosis not present

## 2017-05-09 NOTE — Telephone Encounter (Signed)
   Primary Cardiologist:Jonathan Allyson SabalBerry, MD  Chart reviewed as part of pre-operative protocol coverage. Because of John Conley's past medical history and time since last visit, he/she will require a follow-up visit in order to better assess preoperative cardiovascular risk.  Pre-op covering staff: - Please schedule appointment and call patient to inform them. - Please contact requesting surgeon's office via preferred method (i.e, phone, fax) to inform them of need for appointment prior to surgery.  Theodore Demarkhonda Gordie Belvin, PA-C  05/09/2017, 5:03 PM

## 2017-05-12 ENCOUNTER — Ambulatory Visit: Payer: Medicare Other | Admitting: Family Medicine

## 2017-05-12 ENCOUNTER — Encounter: Payer: Self-pay | Admitting: Family Medicine

## 2017-05-12 VITALS — BP 140/68 | HR 48 | Temp 99.0°F | Ht 68.0 in | Wt 256.4 lb

## 2017-05-12 DIAGNOSIS — R600 Localized edema: Secondary | ICD-10-CM

## 2017-05-12 LAB — COMPREHENSIVE METABOLIC PANEL
ALT: 26 U/L (ref 0–53)
AST: 22 U/L (ref 0–37)
Albumin: 4.1 g/dL (ref 3.5–5.2)
Alkaline Phosphatase: 41 U/L (ref 39–117)
BILIRUBIN TOTAL: 0.6 mg/dL (ref 0.2–1.2)
BUN: 33 mg/dL — AB (ref 6–23)
CO2: 29 mEq/L (ref 19–32)
CREATININE: 1.6 mg/dL — AB (ref 0.40–1.50)
Calcium: 9.4 mg/dL (ref 8.4–10.5)
Chloride: 102 mEq/L (ref 96–112)
GFR: 45.37 mL/min — ABNORMAL LOW (ref >60.00)
GLUCOSE: 173 mg/dL — AB (ref 70–99)
POTASSIUM: 4.8 meq/L (ref 3.5–5.1)
SODIUM: 138 meq/L (ref 135–145)
TOTAL PROTEIN: 6.6 g/dL (ref 6.0–8.3)

## 2017-05-12 LAB — TSH: TSH: 1.29 u[IU]/mL (ref 0.35–4.50)

## 2017-05-12 NOTE — Telephone Encounter (Signed)
Forwarded to requesting party via EPIC 

## 2017-05-12 NOTE — Progress Notes (Signed)
Pre visit review using our clinic review tool, if applicable. No additional management support is needed unless otherwise documented below in the visit note. 

## 2017-05-12 NOTE — Progress Notes (Signed)
Chief Complaint  Patient presents with  . Establish Care       New Patient Visit SUBJECTIVE: HPI: John Conley is an 72 y.o.male who is being seen for establishing care.  The patient was previously seen at [facility]. Dr retired. Here with his wife, John Conley.  Pt has much of his health managed by specialists- endocrinology manages his DM II, hypothyroidism; urology is prostate issues; cardiology his cardiac issues. His PCP was managing his cholesterol and BP regimen.   Over the past 2 weeks, pt has been having issues with swelling in both of his LE's up to his knee. No inj, surg, or change in activity, denies calf pain. He does have a hx of heart failure on Lasix. He doubled his 40 mg/d dose w little improvement. He has not changed his diet or med routine otherwise. His breathing is normal, no cough. He has gained 15 lbs over past 1 mo. He has had back pain that has prevented him from exercising around 1 mo ago. He started back today. No hx of renal failure, he does note that he is set to see a liver specialist at the end of the mo.   Allergies  Allergen Reactions  . Metoprolol Other (See Comments)    Nightmares Nightmares    Past Medical History:  Diagnosis Date  . CAD (coronary artery disease) 2006   X 5. Low risk Nuc 9/13  . Chronic anticoagulation    Eliquis  . Dyslipidemia   . Hypothyroid   . Insulin dependent diabetes mellitus (HCC)   . NSVT (nonsustained ventricular tachycardia) (HCC) 3/14   8 bts-NL LVF on echo  . Obesity   . PAF (paroxysmal atrial fibrillation) (HCC) 3/14   3 hrs on Holter March 2014. Pt declined Coumadin  . PVD (peripheral vascular disease) (HCC) 5/06   Lt renal artery stent- dopplers OK 9/13  . S/P CABG (coronary artery bypass graft) 2006  . Sleep apnea    compliant with C-pap   Past Surgical History:  Procedure Laterality Date  . CARDIAC CATHETERIZATION  03/05/2004   normal L main, LA 80% stenosis in mid region, diagonal w/  90% osital stenosis, ramus intermedius w/ 80% ostial stenosis, L Cfx w/ 80% stenosis in distal AV groove, RCA with 60% prox/90% mid stenosis - subsequent CABG (Dr. Erlene Quan)  . CORONARY ARTERY BYPASS GRAFT  03/14/2004   LIMA to LAD, SVG to distal LAD, Left Radial to Ramus Intermedius, SVG to Diagonal, SVG to PDA (Dr. Kathie Rhodes. Hendrickson)  . NM MYOCAR PERF WALL MOTION  10/2011   lexiscan - no evidence of inducible ischemia, normal/low risk scan  . RENAL ARTERY STENT Left 06/04/2004   6x18 Genesis on Aviator balloon stent to left renal artery (Dr. Erlene Quan)  . RENAL DOPPLER  10/2011   right renal artery - patent; left renal artery - patent  . TRANSTHORACIC ECHOCARDIOGRAM  04/2012   EF 55-60%, mild LVH, mild conc hypertrophy, grade 2 diastolic dysfunction; mildly calcified MV annulus; LA mod dilated; RVSP increased; mild TR; PA peak pressure   Social History   Socioeconomic History  . Marital status: Married   Family History  Problem Relation Age of Onset  . Cancer Mother 22     Current Outpatient Medications:  .  amLODipine (NORVASC) 5 MG tablet, Take 5 mg by mouth., Disp: , Rfl:  .  apixaban (ELIQUIS) 5 MG TABS tablet, Take 1 tablet (5 mg total) by mouth 2 (two) times daily.,  Disp: 180 tablet, Rfl: 0 .  aspirin 81 MG tablet, Take 81 mg by mouth daily., Disp: , Rfl:  .  atenolol (TENORMIN) 25 MG tablet, Take 0.5 tablets (12.5 mg total) by mouth daily., Disp: 45 tablet, Rfl: 3 .  betamethasone dipropionate (DIPROLENE) 0.05 % cream, Apply topically as needed., Disp: , Rfl:  .  fish oil-omega-3 fatty acids 1000 MG capsule, Take 4 g daily by mouth. , Disp: , Rfl:  .  fluticasone (FLONASE) 50 MCG/ACT nasal spray, Place 2 sprays into the nose. , Disp: , Rfl:  .  gabapentin (NEURONTIN) 300 MG capsule, Take 300 mg by mouth daily., Disp: , Rfl:  .  irbesartan (AVAPRO) 300 MG tablet, Take 300 mg by mouth., Disp: , Rfl:  .  levothyroxine (SYNTHROID, LEVOTHROID) 50 MCG tablet, , Disp: , Rfl:  .   meloxicam (MOBIC) 15 MG tablet, Take 15 mg by mouth daily., Disp: , Rfl:  .  Misc Natural Products (PROSTATE HEALTH PO), Take 1 tablet by mouth daily., Disp: , Rfl:  .  Multiple Vitamin (MULTIVITAMIN) capsule, Take 1 capsule by mouth daily., Disp: , Rfl:  .  nitroGLYCERIN (NITROSTAT) 0.4 MG SL tablet, DISSOLVE 1 TABLET UNDER TONGUE EVERY 5 MINUTES UP TO 3 DOSES, Disp: 25 tablet, Rfl: 2 .  NON FORMULARY, OTC supplement with co Q 10. Tru Heart 2 tabs daily, Disp: , Rfl:  .  potassium chloride SA (K-DUR,KLOR-CON) 20 MEQ tablet, Take 1 tablet (20 mEq total) by mouth daily., Disp: 90 tablet, Rfl: 3 .  terazosin (HYTRIN) 5 MG capsule, Take 5 mg by mouth once. , Disp: , Rfl:   ROS Cardiovascular: +:E edema  Respiratory: Denies dyspnea   OBJECTIVE: BP 140/68 (BP Location: Left Arm, Patient Position: Sitting, Cuff Size: Large)   Pulse (!) 48   Temp 99 F (37.2 C) (Oral)   Ht 5\' 8"  (1.727 m)   Wt 256 lb 6 oz (116.3 kg)   SpO2 97%   BMI 38.98 kg/m   Constitutional: -  VS reviewed -  Well developed, well nourished, appears stated age -  No apparent distress  Psychiatric: -  Oriented to person, place, and time -  Memory intact -  Affect and mood normal -  Fluent conversation, good eye contact -  Judgment and insight age appropriate  Eye: -  Conjunctivae clear, no discharge -  Pupils symmetric, round, reactive to light  ENMT: -  MMM    Pharynx moist, no exudate, no erythema  Neck: -  No gross swelling, no palpable masses -  Thyroid midline, not enlarged, mobile, no palpable masses  Cardiovascular: -  RRR -  No bruits -  2+ pitting LE edema b/l up to knees b/l  Respiratory: -  Normal respiratory effort, no accessory muscle use, no retraction -  Breath sounds equal, no wheezes, no ronchi, no crackles  Musculoskeletal: -  No clubbing, no cyanosis -  Gait normal  Skin: -  No significant lesion on inspection -  Warm and dry to palpation   ASSESSMENT/PLAN: Bilateral lower extremity edema  - Plan: Comprehensive metabolic panel, TSH  Patient instructed to sign release of records form from his previous PCP. Ck above labs, given exam, I do not think he is in heart failure. Likely 2/2 decreased activity and weight gain. He is starting to exercise again. Wear compression stockings, mind salt intake. Patient should return in 6 mo for CPE pending above and review of records. The patient voiced understanding and agreement to  the plan.   Jilda Rocheicholas Paul WestminsterWendling, DO 05/12/17  2:38 PM

## 2017-05-12 NOTE — Telephone Encounter (Signed)
Pt has appt 05-22-17

## 2017-05-12 NOTE — Patient Instructions (Addendum)
Peripheral Edema Peripheral edema is swelling that is caused by a buildup of fluid. Peripheral edema most often affects the lower legs, ankles, and feet. It can also develop in the arms, hands, and face. The area of the body that has peripheral edema will look swollen. It may also feel heavy or warm. Your clothes may start to feel tight. Pressing on the area may make a temporary dent in your skin. You may not be able to move your arm or leg as much as usual. There are many causes of peripheral edema. It can be a complication of other diseases, such as congestive heart failure, kidney disease, or a problem with your blood circulation. It also can be a side effect of certain medicines. It often happens to women during pregnancy. Sometimes, the cause is not known. Treating the underlying condition is often the only treatment for peripheral edema. Follow these instructions at home: Pay attention to any changes in your symptoms. Take these actions to help with your discomfort:  Raise (elevate) your legs while you are sitting or lying down.  Move around often to prevent stiffness and to lessen swelling. Do not sit or stand for long periods of time.  Wear support stockings as told by your health care provider.  Follow instructions from your health care provider about limiting salt (sodium) in your diet. Sometimes eating less salt can reduce swelling.  Keep all follow-up visits as told by your health care provider. This is important.  Contact a health care provider if:  You have a fever.  Your edema starts suddenly or is getting worse, especially if you are pregnant or have a medical condition.  You have swelling in only one leg.  You have increased swelling and pain in your legs. Get help right away if:  You develop shortness of breath, especially when you are lying down.  You have pain in your chest or abdomen.  You feel weak.  You faint. This information is not intended to replace advice  given to you by your health care provider. Make sure you discuss any questions you have with your health care provider. Document Released: 02/29/2004 Document Revised: 06/26/2015 Document Reviewed: 08/03/2014 Elsevier Interactive Patient Education  [employer]2018 Elsevier Inc.

## 2017-05-13 ENCOUNTER — Telehealth: Payer: Self-pay | Admitting: *Deleted

## 2017-05-13 NOTE — Telephone Encounter (Signed)
[facility] forwarded to provider/SLS 04/09

## 2017-05-14 DIAGNOSIS — Z7409 Other reduced mobility: Secondary | ICD-10-CM | POA: Diagnosis not present

## 2017-05-14 DIAGNOSIS — M5136 Other intervertebral disc degeneration, lumbar region: Secondary | ICD-10-CM | POA: Diagnosis not present

## 2017-05-16 DIAGNOSIS — Z7409 Other reduced mobility: Secondary | ICD-10-CM | POA: Diagnosis not present

## 2017-05-16 DIAGNOSIS — M5136 Other intervertebral disc degeneration, lumbar region: Secondary | ICD-10-CM | POA: Diagnosis not present

## 2017-05-19 ENCOUNTER — Telehealth: Payer: Self-pay | Admitting: Family Medicine

## 2017-05-19 DIAGNOSIS — Z7409 Other reduced mobility: Secondary | ICD-10-CM | POA: Diagnosis not present

## 2017-05-19 DIAGNOSIS — M5136 Other intervertebral disc degeneration, lumbar region: Secondary | ICD-10-CM | POA: Diagnosis not present

## 2017-05-19 NOTE — Telephone Encounter (Signed)
Previous note signed and I don't know how to access it.   Colonoscopy 06/17/12 w rec f/u in 5 years, follows with [facility] in town.   RE- please update chart for colonoscopy. Please also see other telephone encounter from today's date. TY.

## 2017-05-19 NOTE — Telephone Encounter (Signed)
Reviewed records. Pt follows endo.  Imms have been updated in chart.  Other than flu vaccinations yearly, he is done w PCV, will need updated Td in 2020.  Eye exam- 06/03/16 with Dr. Antony ContrasGraham Lyles.  Cr was 1.4 in Feb, 2019, will recheck in 1 week. 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   Colonoscopy[facility]  RE- please have pt return in 1 week to recheck his kidney function. I have placed the order, he does not need to be fasting. TY.

## 2017-05-19 NOTE — Telephone Encounter (Signed)
Updated maintenance. Called the patient to schedule lab appt---had to leave a message to call back

## 2017-05-19 NOTE — Telephone Encounter (Signed)
Put in his chart.

## 2017-05-20 ENCOUNTER — Other Ambulatory Visit (INDEPENDENT_AMBULATORY_CARE_PROVIDER_SITE_OTHER): Payer: Medicare Other

## 2017-05-20 DIAGNOSIS — R7989 Other specified abnormal findings of blood chemistry: Secondary | ICD-10-CM | POA: Diagnosis not present

## 2017-05-20 DIAGNOSIS — R799 Abnormal finding of blood chemistry, unspecified: Secondary | ICD-10-CM

## 2017-05-20 LAB — COMPREHENSIVE METABOLIC PANEL
ALBUMIN: 4 g/dL (ref 3.5–5.2)
ALK PHOS: 45 U/L (ref 39–117)
ALT: 20 U/L (ref 0–53)
AST: 14 U/L (ref 0–37)
BUN: 36 mg/dL — AB (ref 6–23)
CALCIUM: 9.2 mg/dL (ref 8.4–10.5)
CO2: 27 mEq/L (ref 19–32)
CREATININE: 1.43 mg/dL (ref 0.40–1.50)
Chloride: 103 mEq/L (ref 96–112)
GFR: 51.64 mL/min — ABNORMAL LOW (ref >60.00)
Glucose, Bld: 188 mg/dL — ABNORMAL HIGH (ref 70–99)
Potassium: 4.6 mEq/L (ref 3.5–5.1)
Sodium: 137 mEq/L (ref 135–145)
TOTAL PROTEIN: 6.5 g/dL (ref 6.0–8.3)
Total Bilirubin: 0.4 mg/dL (ref 0.2–1.2)

## 2017-05-21 DIAGNOSIS — M5136 Other intervertebral disc degeneration, lumbar region: Secondary | ICD-10-CM | POA: Diagnosis not present

## 2017-05-21 DIAGNOSIS — Z7409 Other reduced mobility: Secondary | ICD-10-CM | POA: Diagnosis not present

## 2017-05-22 ENCOUNTER — Ambulatory Visit: Payer: Medicare Other | Admitting: Physician Assistant

## 2017-05-22 ENCOUNTER — Encounter: Payer: Self-pay | Admitting: Physician Assistant

## 2017-05-22 VITALS — BP 146/60 | HR 44 | Ht 68.0 in | Wt 255.0 lb

## 2017-05-22 DIAGNOSIS — I2510 Atherosclerotic heart disease of native coronary artery without angina pectoris: Secondary | ICD-10-CM

## 2017-05-22 DIAGNOSIS — I480 Paroxysmal atrial fibrillation: Secondary | ICD-10-CM | POA: Diagnosis not present

## 2017-05-22 DIAGNOSIS — Z01818 Encounter for other preprocedural examination: Secondary | ICD-10-CM | POA: Diagnosis not present

## 2017-05-22 DIAGNOSIS — R001 Bradycardia, unspecified: Secondary | ICD-10-CM

## 2017-05-22 DIAGNOSIS — I509 Heart failure, unspecified: Secondary | ICD-10-CM

## 2017-05-22 DIAGNOSIS — N183 Chronic kidney disease, stage 3 unspecified: Secondary | ICD-10-CM

## 2017-05-22 DIAGNOSIS — Z7901 Long term (current) use of anticoagulants: Secondary | ICD-10-CM

## 2017-05-22 NOTE — Patient Instructions (Signed)
Medication Instructions:  INCREASE- Lasix 2 tablets(80 mg) in the morning and 1 tablet(40 mg) in the evening for 1 week INCREASE- Potassium 20 mg twice a day for 1 week  If you need a refill on your cardiac medications before your next appointment, please call your pharmacy.  Labwork: BMP in 1 week HERE IN OUR OFFICE AT [facility]  Take the provided lab slips with you to the lab for your blood draw.   You will NOT need to fast   Testing/Procedures: Your physician has requested that you have an echocardiogram. Echocardiography is a painless test that uses sound waves to create images of your heart. It provides your doctor with information about the size and shape of your heart and how well your heart's chambers and valves are working. This procedure takes approximately one hour. There are no restrictions for this procedure.  Special Instructions: 2000 mg sodium daily 2 Q or liters of fluid a day  Follow-Up: Your physician wants you to follow-up in: 1 Month with Dr Allyson SabalBerry or Theodore Demarkhonda Barrett.     Thank you for choosing [facility] at [facility]!!

## 2017-05-22 NOTE — Progress Notes (Signed)
Cardiology Office Note   Date:  05/22/2017   ID:  John Conley, DOB 1945/06/18, MRN [id number]  PCP:  Sharlene Dory, DO  Cardiologist: Dr. Allyson Sabal 12/13/2016 Theodore Demark, PA-C   Chief Complaint  Patient presents with  . Follow-up    1 year  . Edema    History of Present Illness: John Conley is a 72 y.o. male with a history of CABG x 5 2006, int MV 11/2016>>med mgt, nl EF, L-renal stent 2006>patent 2015, PAF on Eliquis, HTN, OSA on CPAP  John Conley presents for cardiology follow up.  He never gets palpitations, never feels he is in atrial fib.   Occasional orthostatic dizziness, no recent change.  He gets some pain in his legs when he walks. Stable.  He has developed LE edema, unusual for him. Started a couple of weeks ago. He was on Lasix 40 mg qd and started taking it 2 x day.  He weighs daily, is upset that his weight has increased so much.  He is trying to watch the sodium in his diet, but it admits that he may be getting more than he should.  He is pretty sure he drinks more than 2 L a day.  The same thing happened a couple of years ago, bid Lasix solved it.   He is in a study for his cholesterol at [facility], he takes Omega 3 1000 mg 4 x day. Dr Talmage Nap is following his lipids and diabetes.   He needs a routine colonoscopy, needs medical and pharmacy clearance for it.  His back has been bothering him, getting PT and they told him not to exercise. He has completed PT and will start exercising again next week.   He does cardio and some weights. Never gets CP or SOB with this.    Past Medical History:  Diagnosis Date  . CAD (coronary artery disease) 2006   X 5. Low risk Nuc 9/13  . Chronic anticoagulation    Eliquis  . Dyslipidemia   . Hypothyroid   . Insulin dependent diabetes mellitus (HCC)   . NSVT (nonsustained ventricular tachycardia) (HCC) 3/14   8 bts-NL LVF on echo  . Obesity   . PAF (paroxysmal atrial  fibrillation) (HCC) 3/14   3 hrs on Holter March 2014. Pt declined Coumadin  . PVD (peripheral vascular disease) (HCC) 5/06   Lt renal artery stent- dopplers OK 9/13  . S/P CABG (coronary artery bypass graft) 2006  . Sleep apnea    compliant with C-pap    Past Surgical History:  Procedure Laterality Date  . CARDIAC CATHETERIZATION  03/05/2004   normal L main, LA 80% stenosis in mid region, diagonal w/ 90% osital stenosis, ramus intermedius w/ 80% ostial stenosis, L Cfx w/ 80% stenosis in distal AV groove, RCA with 60% prox/90% mid stenosis - subsequent CABG (Dr. Erlene Quan)  . CORONARY ARTERY BYPASS GRAFT  03/14/2004   LIMA to LAD, SVG to distal LAD, Left Radial to Ramus Intermedius, SVG to Diagonal, SVG to PDA (Dr. Kathie Rhodes. Hendrickson)  . NM MYOCAR PERF WALL MOTION  10/2011   lexiscan - no evidence of inducible ischemia, normal/low risk scan  . RENAL ARTERY STENT Left 06/04/2004   6x18 Genesis on Aviator balloon stent to left renal artery (Dr. Erlene Quan)  . RENAL DOPPLER  10/2011   right renal artery - patent; left renal artery - patent  . TRANSTHORACIC ECHOCARDIOGRAM  04/2012   EF 55-60%, mild LVH,  mild conc hypertrophy, grade 2 diastolic dysfunction; mildly calcified MV annulus; LA mod dilated; RVSP increased; mild TR; PA peak pressure 38mmHg    Current Outpatient Medications  Medication Sig Dispense Refill  . amLODipine (NORVASC) 5 MG tablet Take 5 mg by mouth.    Marland Kitchen. apixaban (ELIQUIS) 5 MG TABS tablet Take 1 tablet (5 mg total) by mouth 2 (two) times daily. 180 tablet 0  . aspirin 81 MG tablet Take 81 mg by mouth daily.    Marland Kitchen. atenolol (TENORMIN) 25 MG tablet Take 0.5 tablets (12.5 mg total) by mouth daily. 45 tablet 3  . betamethasone dipropionate (DIPROLENE) 0.05 % cream Apply topically as needed.    . fish oil-omega-3 fatty acids 1000 MG capsule Take 4 g daily by mouth.     . fluticasone (FLONASE) 50 MCG/ACT nasal spray Place 2 sprays into the nose.     . gabapentin (NEURONTIN) 300 MG capsule  Take 300 mg by mouth daily.    . irbesartan (AVAPRO) 300 MG tablet Take 300 mg by mouth.    . levothyroxine (SYNTHROID, LEVOTHROID) 50 MCG tablet     . Misc Natural Products (PROSTATE HEALTH PO) Take 1 tablet by mouth daily.    . Multiple Vitamin (MULTIVITAMIN) capsule Take 1 capsule by mouth daily.    . nitroGLYCERIN (NITROSTAT) 0.4 MG SL tablet DISSOLVE 1 TABLET UNDER TONGUE EVERY 5 MINUTES UP TO 3 DOSES 25 tablet 2  . NON FORMULARY OTC supplement with co Q 10. Tru Heart 2 tabs daily    . potassium chloride SA (K-DUR,KLOR-CON) 20 MEQ tablet Take 1 tablet (20 mEq total) by mouth daily. 90 tablet 3  . terazosin (HYTRIN) 5 MG capsule Take 5 mg by mouth once.      No current facility-administered medications for this visit.     Allergies:   Metoprolol    Social History:  The patient  reports that he quit smoking about 28 years ago. He has never used smokeless tobacco.   Family History:  The patient's family history includes Cancer (age of onset: 669) in his mother.    ROS:  Please see the history of present illness. All other systems are reviewed and negative.    PHYSICAL EXAM: VS:  BP (!) 146/60 (BP Location: Left Arm, Patient Position: Sitting, Cuff Size: Normal)   Pulse (!) 44   Ht 5\' 8"  (1.727 m)   Wt 255 lb (115.7 kg)   BMI 38.77 kg/m  , BMI Body mass index is 38.77 kg/m. GEN: Well nourished, well developed, male in no acute distress  HEENT: normal for age  Neck: no JVD seen but difficult to assess secondary to body habitus, no carotid bruit, no masses Cardiac: RRR; soft murmur, no rubs, or gallops Respiratory: Decreased breath sounds bases bilaterally, normal work of breathing GI: Firm, nontender, nondistended, + BS MS: no deformity or atrophy; 1+ lower extremity edema; distal pulses are 2+ in all 4 extremities   Skin: warm and dry, no rash Neuro:  Strength and sensation are intact Psych: euthymic mood, full affect   EKG:  EKG is ordered today. The ECG is sinus  bradycardia, heart rate 44, no significant change from previous ECGs  Recent Labs: 05/12/2017: TSH 1.29 05/20/2017: ALT 20; BUN 36; Creatinine, Ser 1.43; Potassium 4.6; Sodium 137    Lipid Panel No results found for: CHOL, TRIG, HDL, CHOLHDL, VLDL, LDLCALC, LDLDIRECT   Wt Readings from Last 3 Encounters:  05/22/17 255 lb (115.7 kg)  05/12/17 256 lb  6 oz (116.3 kg)  12/13/16 240 lb (108.9 kg)     Other studies Reviewed: Additional studies/ records that were reviewed today include: Office notes, hospital records and testing.  ASSESSMENT AND PLAN:  1.  CAD: He is on good medical therapy with baby aspirin, beta-blocker and ARB.  2.  Bradycardia: His heart rate is chronically low and he is asymptomatic with a heart rate in the 40s.  Continue low-dose beta-blocker.  3.  Preoperative evaluation: He is having no ongoing ischemic symptoms.  We are optimizing his volume status, but no further ischemic evaluation needs to be performed prior to his colonoscopy.  4.  Acute on chronic CHF, likely diastolic: Compliance with a low-sodium diet and 2 L of fluid restriction is encouraged.  He is on furosemide, currently 40 mg chronically, now 40 mg twice daily.  Increase this to 80 mg a.m. and 40 mg p.m. to get the fluid off.  Check a BMET in 1 week.  Increased potassium along with the furosemide.  If he does not lose any weight, he will need to be seen again.  Check an echocardiogram.  5.  Chronic anticoagulation: He has not missed any doses of his Eliquis.  They want to hold it for the colonoscopy.  He has never had a TIA/CVA.  Will review with pharmacy.  6.  PAF: He does not believe he has had any and he is in sinus rhythm today, continue current therapy.   Current medicines are reviewed at length with the patient today.  The patient does not have concerns regarding medicines.  The following changes have been made: Increase Lasix and potassium  Labs/ tests ordered today include:   Orders Placed  This Encounter  Procedures  . Basic Metabolic Panel (BMET)  . EKG 12-Lead  . ECHOCARDIOGRAM COMPLETE     Disposition:   FU with Dr. Allyson Sabal or myself in a month  Signed, Theodore Demark, PA-C  05/22/2017 3:13 PM     [facility] Phone: [phone]; Fax: [phone]  This note was written with the assistance of speech recognition software. Please excuse any transcriptional errors.

## 2017-06-02 ENCOUNTER — Other Ambulatory Visit: Payer: Medicare Other | Admitting: *Deleted

## 2017-06-02 ENCOUNTER — Other Ambulatory Visit: Payer: Self-pay

## 2017-06-02 ENCOUNTER — Ambulatory Visit (HOSPITAL_COMMUNITY): Payer: Medicare Other | Attending: Cardiovascular Disease

## 2017-06-02 ENCOUNTER — Telehealth: Payer: Self-pay

## 2017-06-02 DIAGNOSIS — E785 Hyperlipidemia, unspecified: Secondary | ICD-10-CM | POA: Insufficient documentation

## 2017-06-02 DIAGNOSIS — Z79899 Other long term (current) drug therapy: Secondary | ICD-10-CM | POA: Diagnosis not present

## 2017-06-02 DIAGNOSIS — I480 Paroxysmal atrial fibrillation: Secondary | ICD-10-CM | POA: Insufficient documentation

## 2017-06-02 DIAGNOSIS — I739 Peripheral vascular disease, unspecified: Secondary | ICD-10-CM | POA: Diagnosis not present

## 2017-06-02 DIAGNOSIS — I2510 Atherosclerotic heart disease of native coronary artery without angina pectoris: Secondary | ICD-10-CM | POA: Diagnosis not present

## 2017-06-02 LAB — BASIC METABOLIC PANEL
BUN / CREAT RATIO: 25 — AB (ref 10–24)
BUN: 37 mg/dL — AB (ref 8–27)
CHLORIDE: 99 mmol/L (ref 96–106)
CO2: 24 mmol/L (ref 20–29)
Calcium: 9.6 mg/dL (ref 8.6–10.2)
Creatinine, Ser: 1.51 mg/dL — ABNORMAL HIGH (ref 0.76–1.27)
GFR calc Af Amer: 53 mL/min/{1.73_m2} — ABNORMAL LOW (ref >59)
GFR calc non Af Amer: 45 mL/min/{1.73_m2} — ABNORMAL LOW (ref >59)
GLUCOSE: 199 mg/dL — AB (ref 65–99)
Potassium: 4.8 mmol/L (ref 3.5–5.2)
SODIUM: 140 mmol/L (ref 134–144)

## 2017-06-02 NOTE — Telephone Encounter (Signed)
   [facility] Pre-operative Risk Assessment    Request for surgical clearance:  1. What type of surgery is being performed? Colonoscopy   2. When is this surgery scheduled? 06/25/2017   3. What type of clearance is required (medical clearance vs. Pharmacy clearance to hold med vs. Both)? Both  4. Are there any medications that need to be held prior to surgery and how long?Eliquis (night before and morning of is request sent from provider)   5. Practice name and name of physician performing surgery? [facility] Dr. Herbie Baltimore Buccini   6. What is your office phone number: [phone]    7.   What is your office fax number: [phone]  8.   Anesthesia type (None, local, MAC, general) ? n/a   John Conley 06/02/2017, 4:54 PM  _________________________________________________________________   (provider comments below)

## 2017-06-03 NOTE — Telephone Encounter (Signed)
Pt takes Eliquis for afib with CHADs2VASc score of 4 (age, CAD, HTN, DM). SCr is 1.51, CrCl is 6mL/min. Ok to hold Eliquis for 1-2 days prior to procedure as needed.

## 2017-06-03 NOTE — Telephone Encounter (Signed)
Medical clearance provided by Rhona Barrett on 05/22/17.  Will route to pharmacy for anticoagulation guidance.

## 2017-06-04 DIAGNOSIS — H5203 Hypermetropia, bilateral: Secondary | ICD-10-CM | POA: Diagnosis not present

## 2017-06-04 DIAGNOSIS — E113292 Type 2 diabetes mellitus with mild nonproliferative diabetic retinopathy without macular edema, left eye: Secondary | ICD-10-CM | POA: Diagnosis not present

## 2017-06-04 DIAGNOSIS — H26493 Other secondary cataract, bilateral: Secondary | ICD-10-CM | POA: Diagnosis not present

## 2017-06-04 NOTE — Telephone Encounter (Signed)
Per Bjorn Loser  Barrett 05/22/17 "3.  Preoperative evaluation: He is having no ongoing ischemic symptoms.  We are optimizing his volume status, but no further ischemic evaluation needs to be performed prior to his colonoscopy".   I will route this recommendation to the requesting party via Epic fax function and remove from pre-op pool.  Please call with questions.  [place], [place] 06/04/2017, 1:33 PM

## 2017-06-11 DIAGNOSIS — M5416 Radiculopathy, lumbar region: Secondary | ICD-10-CM | POA: Diagnosis not present

## 2017-06-11 DIAGNOSIS — M5136 Other intervertebral disc degeneration, lumbar region: Secondary | ICD-10-CM | POA: Diagnosis not present

## 2017-06-15 ENCOUNTER — Encounter: Payer: Self-pay | Admitting: Family Medicine

## 2017-06-16 ENCOUNTER — Encounter: Payer: Self-pay | Admitting: Family Medicine

## 2017-06-16 ENCOUNTER — Ambulatory Visit (INDEPENDENT_AMBULATORY_CARE_PROVIDER_SITE_OTHER): Payer: Medicare Other | Admitting: Family Medicine

## 2017-06-16 VITALS — BP 122/68 | HR 50 | Temp 98.6°F | Ht 68.0 in | Wt 255.0 lb

## 2017-06-16 DIAGNOSIS — M7912 Myalgia of auxiliary muscles, head and neck: Secondary | ICD-10-CM | POA: Diagnosis not present

## 2017-06-16 DIAGNOSIS — K1379 Other lesions of oral mucosa: Secondary | ICD-10-CM | POA: Diagnosis not present

## 2017-06-16 NOTE — Patient Instructions (Signed)
Heat (pad or rice pillow in microwave) over affected area, 10-15 minutes twice daily.   OK to take Tylenol 1000 mg (2 extra strength tabs) or 975 mg (3 regular strength tabs) every 6 hours as needed.  Try not to aggravate it.  Stretch the area twice daily for 30 seconds at a time. Massage can be helpful.  If you want to see a surgeon for your mouth, let us know. This would be more cosmetic.  Let us know if you need anything.

## 2017-06-16 NOTE — Progress Notes (Signed)
Chief Complaint  Patient presents with  . growth inside of mouth.  Throat/neck pain    Subjective: Patient is a 71 y.o. male here for growth on the inside of the mouth.  Noticed for around 1 mo, no inj or exposure. No pain or drainage. Saw dentist who is not concerned. He has never used PO tobacco and has not smoked in nearly 30 years.  Also having 2 d of R sided neck pain. Does not think this is related. He did accidentally choke prompting a coughing fit that could have caused it, but no other known trigger.   ROS: HEENT: As noted in HPI   Past Medical History:  Diagnosis Date  . CAD (coronary artery disease) 2006   X 5. Low risk Nuc 9/13  . Chronic anticoagulation    Eliquis  . Dyslipidemia   . Hypothyroid   . Insulin dependent diabetes mellitus (HCC)   . NSVT (nonsustained ventricular tachycardia) (HCC) 3/14   8 bts-NL LVF on echo  . Obesity   . PAF (paroxysmal atrial fibrillation) (HCC) 3/14   3 hrs on Holter March 2014. Pt declined Coumadin  . PVD (peripheral vascular disease) (HCC) 5/06   Lt renal artery stent- dopplers OK 9/13  . S/P CABG (coronary artery bypass graft) 2006  . Sleep apnea    compliant with C-pap   Objective: BP 122/68 (BP Location: Left Arm, Patient Position: Sitting, Cuff Size: Large)   Pulse (!) 50   Temp 98.6 F (37 C) (Oral)   Ht  (1.727 m)   Wt 255 lb (115.7 kg)   SpO2 95%   BMI 38.77 kg/m  General: Awake, appears stated age HEENT: Subglossal mucocele approx 3 mm in diameter, no erythema or drainage Lungs: No accessory muscle use MSK: +TTP over R SCM Lymph: No enlarged or painful LN's Psych: Age appropriate judgment and insight, normal affect and mood  Assessment and Plan: Mucocele of mouth  Sternocleidomastoid muscle tenderness  Offered referral to ENT, but stated that this will likely self resolve or not cause any issues. Let us know if there are changes. Stretch, heat, activity as tolerated, massage, Tylenol. F/u prn.   The patient voiced understanding and agreement to the plan.  Jilda Roche [place], DO 06/16/17  3:19 PM

## 2017-06-16 NOTE — Progress Notes (Signed)
Pre visit review using our clinic review tool, if applicable. No additional management support is needed unless otherwise documented below in the visit note. 

## 2017-06-17 ENCOUNTER — Telehealth: Payer: Self-pay

## 2017-06-17 NOTE — Telephone Encounter (Signed)
   [facility] Pre-operative Risk Assessment    Request for surgical clearance:  1. What type of surgery is being performed? Colonoscopy   2. When is this surgery scheduled? 06/25/17   3. What type of clearance is required (medical clearance vs. Pharmacy clearance to hold med vs. Both)? Both  4. Are there any medications that need to be held prior to surgery and how long?ASA/Eliquis-hold night before and morning of procedure   5. Practice name and name of physician performing surgery? [facility]   6. What is your office phone number [phone]    7.   What is your office fax number [phone]  8.   Anesthesia type (None, local, MAC, general) ? Unknown   Meryl Crutch 06/17/2017, 7:45 AM  _________________________________________________________________   (provider comments below)

## 2017-06-17 NOTE — Telephone Encounter (Signed)
Anticoag clearance was already provided in 4/29 clearance request.

## 2017-06-17 NOTE — Telephone Encounter (Signed)
   Primary Cardiologist:John Allyson Sabal, MD  Chart reviewed as part of pre-operative protocol coverage.  72 y.o. male with hx of CAD s/p CABG in 2006, RAS s/p L RA stent in 2006, PAF on Eliquis, diastolic CHF. Patient evaluated by John Demark, PA-C 05/22/17.  He was volume overloaded and diuresis was adjusted.  He has follow up 07/01/17 with Dr. Nanetta Conley.    Chart reviewed by pharmacy 06/03/17 with the following recommendations:  "Pt takes Eliquis for afib with CHADs2VASc score of 4 (age, CAD, HTN, DM). SCr is 1.51, CrCl is 72mL/min. Ok to hold Eliquis for 1-2 days prior to procedure as needed."  I will route to [place] Barrett to clarify if patient ok to proceed with colonoscopy or needs to follow up with Dr. Allyson Conley before proceeding.  John Newcomer, PA-C  06/17/2017, 3:59 PM

## 2017-06-20 NOTE — Telephone Encounter (Signed)
   Phone call to make sure his respiratory status is stable is needed if he wishes to get the colonoscopy done before scheduled f/u with Dr Allyson Sabal 05/28.  Theodore Demark, PA-C 06/20/2017 12:47 PM Beeper [phone]

## 2017-06-23 NOTE — Telephone Encounter (Signed)
   Primary Cardiologist: Nanetta Batty, MD  Chart reviewed as part of pre-operative protocol coverage. Patient was contacted 06/23/2017 in reference to pre-operative risk assessment for pending surgery as outlined below.  Attikus Bartoszek Reagle was last seen on 05/22/17 by Theodore Demark, PA.  On that day the patient was given a short run of lasix for increased edema. Since that day, KEE DRUDGE has done well. He still has some LE edema and plans to see his endocrinologist for this. He was not having any ischemic symptoms or dyspnea. Currently he is feeling very well with no symptoms.   Therefore, based on ACC/AHA guidelines, the patient would be at acceptable risk for the planned procedure without further cardiovascular testing.   Per our pharmacy recommendations: Pt takes Eliquis for afib with CHADs2VASc score of 4 (age, CAD, HTN, DM). SCr is 1.51, CrCl is 5mL/min. Ok to hold Eliquis for 1-2 days prior to procedure as needed.  I will route this recommendation to the requesting party via Epic fax function and remove from pre-op pool.  Please call with questions.  Berton Bon, NP 06/23/2017, 2:54 PM

## 2017-06-25 DIAGNOSIS — Z86010 Personal history of colonic polyps: Secondary | ICD-10-CM | POA: Diagnosis not present

## 2017-06-26 DIAGNOSIS — E785 Hyperlipidemia, unspecified: Secondary | ICD-10-CM | POA: Diagnosis not present

## 2017-06-26 DIAGNOSIS — M545 Low back pain: Secondary | ICD-10-CM | POA: Diagnosis not present

## 2017-06-26 DIAGNOSIS — M47816 Spondylosis without myelopathy or radiculopathy, lumbar region: Secondary | ICD-10-CM | POA: Diagnosis not present

## 2017-06-26 DIAGNOSIS — E1122 Type 2 diabetes mellitus with diabetic chronic kidney disease: Secondary | ICD-10-CM | POA: Diagnosis not present

## 2017-06-26 DIAGNOSIS — M5416 Radiculopathy, lumbar region: Secondary | ICD-10-CM | POA: Diagnosis not present

## 2017-06-26 DIAGNOSIS — E039 Hypothyroidism, unspecified: Secondary | ICD-10-CM | POA: Diagnosis not present

## 2017-06-26 DIAGNOSIS — I129 Hypertensive chronic kidney disease with stage 1 through stage 4 chronic kidney disease, or unspecified chronic kidney disease: Secondary | ICD-10-CM | POA: Diagnosis not present

## 2017-07-01 ENCOUNTER — Encounter: Payer: Self-pay | Admitting: Cardiovascular Disease

## 2017-07-01 ENCOUNTER — Ambulatory Visit: Payer: Medicare Other | Admitting: Cardiovascular Disease

## 2017-07-01 DIAGNOSIS — I739 Peripheral vascular disease, unspecified: Secondary | ICD-10-CM

## 2017-07-01 DIAGNOSIS — E785 Hyperlipidemia, unspecified: Secondary | ICD-10-CM

## 2017-07-01 DIAGNOSIS — M5136 Other intervertebral disc degeneration, lumbar region: Secondary | ICD-10-CM | POA: Diagnosis not present

## 2017-07-01 DIAGNOSIS — M545 Low back pain: Secondary | ICD-10-CM | POA: Diagnosis not present

## 2017-07-01 DIAGNOSIS — I480 Paroxysmal atrial fibrillation: Secondary | ICD-10-CM

## 2017-07-01 DIAGNOSIS — I2510 Atherosclerotic heart disease of native coronary artery without angina pectoris: Secondary | ICD-10-CM

## 2017-07-01 DIAGNOSIS — G4733 Obstructive sleep apnea (adult) (pediatric): Secondary | ICD-10-CM

## 2017-07-01 NOTE — Assessment & Plan Note (Signed)
History of obstructive sleep apnea currently not on CPAP. 

## 2017-07-01 NOTE — Assessment & Plan Note (Signed)
History of essential hypertension her blood pressure measured at 195/69.  He is on atenolol, amlodipine, and Avapro.  He says his blood pressure usually runs in the 120 range systolic at home.  I am going to repeat renal Doppler studies and I have asked him to follow his blood pressure on his home blood pressure monitor. he is aware salt restriction.

## 2017-07-01 NOTE — Assessment & Plan Note (Signed)
History of dyslipidemia not on statin therapy followed by his PCP. 

## 2017-07-01 NOTE — Assessment & Plan Note (Signed)
History of left renal artery stenting 5/06.  His last Dopplers performed in 2015 showed this to be widely patent.

## 2017-07-01 NOTE — Progress Notes (Signed)
07/01/2017 John Conley   07/22/1945  [id number]  Primary Physician Carmelia Roller, Jilda Roche, DO Primary Cardiologist: Runell Gess MD Milagros Loll, [place], [place]  HPI:  John Conley is a 72 y.o.  moderately overweight, married Caucasian male, father of 2 children (daughter [name] was an employee here and currently works for [employer] ) who I last saw in the office  11/08/2016.Marland Kitchen He has a history of CAD status post coronary artery bypass grafting x5 in 2006. He had a LIMA to his LAD, a left radial to a ramus intermedius, a vein to the distal LAD and a vein to diagonal branch. He had a Myoview performed in September 2013 which was low risk, normal LV function. He does have obstructive sleep apnea although he is noncompliant with his CPAP. He has also had left renal artery stenting in 2006 with followup Dopplers that were normal. He was complaining of some dizziness when he was seen a month ago. His meds were adjusted by his primary care doctor. He had an echo performed in our office that revealed normal LV systolic function and a Myoview that was normal as well. An event monitor showed nonsustained ventricular tachycardia and some PAF. Based on this, he was begun on low dose beta blocker and Eliquis because of his high CHADS2 VASc score of 4. Since I saw him 6 months ago continues to complain of some episodic dizziness. He also complains of symptoms compatible with claudication and dyspnea with occasional chest pain. He did have a negative Myoview in 2015 and a fairly normal 2-D echo. He had a normal echo performed 06/02/2016 and a normal Myoview performed late last year.  He does have some mild to moderate lower extremity edema however.     Current Meds  Medication Sig  . amLODipine (NORVASC) 5 MG tablet Take 5 mg by mouth.  Marland Kitchen apixaban (ELIQUIS) 5 MG TABS tablet Take 1 tablet (5 mg total) by mouth 2 (two) times daily.  Marland Kitchen aspirin 81 MG tablet Take 81 mg by mouth daily.  Marland Kitchen atenolol  (TENORMIN) 25 MG tablet Take 0.5 tablets (12.5 mg total) by mouth daily.  . betamethasone dipropionate (DIPROLENE) 0.05 % cream Apply topically as needed.  . fish oil-omega-3 fatty acids 1000 MG capsule Take 4 g daily by mouth.   . fluticasone (FLONASE) 50 MCG/ACT nasal spray Place 2 sprays into the nose.   . furosemide (LASIX) 40 MG tablet Take 40 mg by mouth daily.  . irbesartan (AVAPRO) 300 MG tablet Take 300 mg by mouth.  . levothyroxine (SYNTHROID, LEVOTHROID) 50 MCG tablet   . Misc Natural Products (PROSTATE HEALTH PO) Take 1 tablet by mouth daily.  . Multiple Vitamin (MULTIVITAMIN) capsule Take 1 capsule by mouth daily.  . nitroGLYCERIN (NITROSTAT) 0.4 MG SL tablet DISSOLVE 1 TABLET UNDER TONGUE EVERY 5 MINUTES UP TO 3 DOSES  . NON FORMULARY OTC supplement with co Q 10. Tru Heart 2 tabs daily  . potassium chloride SA (K-DUR,KLOR-CON) 20 MEQ tablet Take 1 tablet (20 mEq total) by mouth daily.  Marland Kitchen terazosin (HYTRIN) 5 MG capsule Take 5 mg by mouth once.   . [DISCONTINUED] gabapentin (NEURONTIN) 300 MG capsule Take 300 mg by mouth daily.     Allergies  Allergen Reactions  . Metoprolol Other (See Comments)    Nightmares Nightmares    Social History   Socioeconomic History  . Marital status: Married    Spouse name: Not on file  . Number of children:  2  . Years of education: Not on file  . Highest education level: Not on file  Occupational History    Employer: [employer]  Social Needs  . Financial resource strain: Not on file  . Food insecurity:    Worry: Not on file    Inability: Not on file  . Transportation needs:    Medical: Not on file    Non-medical: Not on file  Tobacco Use  . Smoking status: Former Smoker    Last attempt to quit: 12/02/1988    Years since quitting: 28.5  . Smokeless tobacco: Never Used  Substance and Sexual Activity  . Alcohol use: Not Currently  . Drug use: Never  . Sexual activity: Not on file  Lifestyle  . Physical activity:     Days per week: Not on file    Minutes per session: Not on file  . Stress: Not on file  Relationships  . Social connections:    Talks on phone: Not on file    Gets together: Not on file    Attends religious service: Not on file    Active member of club or organization: Not on file    Attends meetings of clubs or organizations: Not on file    Relationship status: Not on file  . Intimate partner violence:    Fear of current or ex partner: Not on file    Emotionally abused: Not on file    Physically abused: Not on file    Forced sexual activity: Not on file  Other Topics Concern  . Not on file  Social History Narrative   Married, 2 childen     Review of Systems: General: negative for chills, fever, night sweats or weight changes.  Cardiovascular: negative for chest pain, dyspnea on exertion, edema, orthopnea, palpitations, paroxysmal nocturnal dyspnea or shortness of breath Dermatological: negative for rash Respiratory: negative for cough or wheezing Urologic: negative for hematuria Abdominal: negative for nausea, vomiting, diarrhea, bright red blood per rectum, melena, or hematemesis Neurologic: negative for visual changes, syncope, or dizziness All other systems reviewed and are otherwise negative except as noted above.    Blood pressure (!) 190/68, pulse (!) 52, height  (1.727 m), weight 260 lb 9.6 oz (118.2 kg).  General appearance: alert and no distress Neck: no adenopathy, no carotid bruit, no JVD, supple, symmetrical, trachea midline and thyroid not enlarged, symmetric, no tenderness/mass/nodules Lungs: clear to auscultation bilaterally Heart: regular rate and rhythm, S1, S2 normal, no murmur, click, rub or gallop Extremities: 1-2+ pitting edema bilaterally Pulses: 2+ and symmetric Skin: Skin color, texture, turgor normal. No rashes or lesions Neurologic: Alert and oriented X 3, normal strength and tone. Normal symmetric reflexes. Normal coordination and gait  EKG  not performed today  ASSESSMENT AND PLAN:   CAD - CABG X 5 '06. Low risk Nuc 9/13 History of CAD coronary artery bypass grafting x5 in 2006.  He had a LIMA to his LAD, left radial to the ramus branch, vein to the distal LAD and diagonal branches.  Myoview performed 11/21/2016 was low risk.  Denies chest pain.  Sleep apnea- on C-pap History of obstructive sleep apnea currently not on CPAP.  Dyslipidemia History of dyslipidemia not on statin therapy followed by his PCP.  PAF on Holter 3/14 History  of paroxysmal atrial fibrillation requests oral anticoagulation.  He is maintaining sinus rhythm.  PVD - Lt RAS s/p stent 5/06. Dopplers OK 9/13 History of left renal artery stenting 5/06.  His  last Dopplers performed in 2015 showed this to be widely patent.  Essential hypertension History of essential hypertension her blood pressure measured at 195/69.  He is on atenolol, amlodipine, and Avapro.  He says his blood pressure usually runs in the 120 range systolic at home.  I am going to repeat renal Doppler studies and I have asked him to follow his blood pressure on his home blood pressure monitor. he is aware salt restriction.      Runell Gess MD FACP,[name],[name], [facility] 07/01/2017 4:06 PM

## 2017-07-01 NOTE — Assessment & Plan Note (Signed)
History of CAD coronary artery bypass grafting x5 in 2006.  He had a LIMA to his LAD, left radial to the ramus branch, vein to the distal LAD and diagonal branches.  Myoview performed 11/21/2016 was low risk.  Denies chest pain.

## 2017-07-01 NOTE — Patient Instructions (Signed)
Medication Instructions: Your physician recommends that you continue on your current medications as directed. Please refer to the Current Medication list given to you today.   Follow-Up: We request that you follow-up in: 6 months with Rhonda Barrett, PA and in 12 months with Dr Berry  You will receive a reminder letter in the mail two months in advance. If you don't receive a letter, please call our office to schedule the follow-up appointment.  If you need a refill on your cardiac medications before your next appointment, please call your pharmacy.  

## 2017-07-01 NOTE — Assessment & Plan Note (Signed)
History  of paroxysmal atrial fibrillation requests oral anticoagulation.  He is maintaining sinus rhythm.

## 2017-07-02 ENCOUNTER — Ambulatory Visit: Payer: Medicare Other | Admitting: Cardiovascular Disease

## 2017-07-08 ENCOUNTER — Other Ambulatory Visit: Payer: Self-pay | Admitting: Cardiovascular Disease

## 2017-07-08 ENCOUNTER — Telehealth: Payer: Self-pay | Admitting: Cardiovascular Disease

## 2017-07-08 NOTE — Telephone Encounter (Signed)
Called patient and LVM to call back to schedule renal doppler.

## 2017-07-10 ENCOUNTER — Encounter: Payer: Self-pay | Admitting: Registered"

## 2017-07-10 ENCOUNTER — Encounter: Payer: Medicare Other | Attending: Family Medicine | Admitting: Registered"

## 2017-07-10 DIAGNOSIS — Z6841 Body Mass Index (BMI) 40.0 and over, adult: Secondary | ICD-10-CM | POA: Diagnosis not present

## 2017-07-10 DIAGNOSIS — E119 Type 2 diabetes mellitus without complications: Secondary | ICD-10-CM | POA: Diagnosis not present

## 2017-07-10 DIAGNOSIS — Z713 Dietary counseling and surveillance: Secondary | ICD-10-CM | POA: Diagnosis not present

## 2017-07-10 NOTE — Patient Instructions (Signed)
Goals:  Follow Diabetes Meal Plan as instructed  Eat 3 meals and 2 snacks, every 3-5 hrs  Limit carbohydrate intake to 45-60 grams carbohydrate/meal  Limit carbohydrate intake to 15-30 grams carbohydrate/snack  Add lean protein foods to meals/snacks  Monitor glucose levels as instructed by your doctor  Aim for 30 mins of physical activity daily  Bring food record and glucose log to your next nutrition visit 

## 2017-07-10 NOTE — Progress Notes (Signed)
Diabetes Self-Management Education  Visit Type:  First/Initial  Appt. Start Time: 2:00 Appt. End Time: 3:15  07/10/2017  [name], identified by name and date of birth, is a 72 y.o. male with a diagnosis of Diabetes: Type 2.    Pt expectations: what he should be eating and how to plan diet   Pt states when his BS is below 90, he expeiences  Hypoglycemic signs and symptoms. Pt states he has has 2 experiences recently of low BS (72-74) over the weekend. Pt states doctor has temporarily placed him on low sodium diet. Pt states he has been retaining a lot of fluid lately.   ASSESSMENT  Height 5\' 8"  (1.727 m), weight 263 lb 8 oz (119.5 kg). Body mass index is 40.07 kg/m.   Diabetes Self-Management Education - 07/10/17 1410      Health Coping   How would you rate your overall health?  Good      Psychosocial Assessment   Patient Belief/Attitude about Diabetes  Motivated to manage diabetes    Self-care barriers  None    Self-management support  Family;Doctor's office    Special Needs  None    Preferred Learning Style  No preference indicated    Learning Readiness  Ready      Complications   Last HgB A1C per patient/outside source  7.9 %    How often do you check your blood sugar?  1-2 times/day    Fasting Blood glucose range (mg/dL)  [phone]    Postprandial Blood glucose range (mg/dL)  [phone]    Number of hypoglycemic episodes per month  2    Can you tell when your blood sugar is low?  Yes    What do you do if your blood sugar is low?  take 3-4 glucose tablets or jelly beans, then feels better and goes on with day    Number of hyperglycemic episodes per week  1    Can you tell when your blood sugar is high?  Yes    What do you do if your blood sugar is high?  change eating habits, drink alot of fluids    Have you had a dilated eye exam in the past 12 months?  Yes    Have you had a dental exam in the past 12 months?  Yes    Are you checking your feet?  Yes     How many days per week are you checking your feet?  5      Dietary Intake   Breakfast  2 slices of toast + sugar free jelly/butter or yogurt or oatmeal/cereal    Snack (morning)  none    Lunch  sandwich or salad or hamburger + fries/onion rings or chicken salad on wheat bread    Snack (afternoon)  unsalted roasted peanuts or sugar-free pudding    Dinner  steak or chicken or pasta or pizza or fish + green beans or carrots or baked potato    Snack (evening)  sugar-free jello    Beverage(s)  decaf coffee, sugar-free soda, water, sparkling water      Exercise   Exercise Type  Moderate (swimming / aerobic walking) recumbent stepper, treadmill, strength-training    How many days per week to you exercise?  5    How many minutes per day do you exercise?  30    Total minutes per week of exercise  150      Patient Education   Previous Diabetes Education  Yes (please comment)    Nutrition management   Role of diet in the treatment of diabetes and the relationship between the three main macronutrients and blood glucose level;Food label reading, portion sizes and measuring food.;Carbohydrate counting;Effects of alcohol on blood glucose and safety factors with consumption of alcohol.;Meal options for control of blood glucose level and chronic complications.;Information on hints to eating out and maintain blood glucose control.;Reviewed blood glucose goals for pre and post meals and how to evaluate the patients' food intake on their blood glucose level.    Physical activity and exercise   Role of exercise on diabetes management, blood pressure control and cardiac health.    Monitoring  Purpose and frequency of SMBG.;Taught/discussed recording of test results and interpretation of SMBG.;Identified appropriate SMBG and/or A1C goals.;Daily foot exams;Yearly dilated eye exam    Chronic complications  Dental care;Retinopathy and reason for yearly dilated eye exams;Reviewed with patient heart disease, higher  risk of, and prevention;Lipid levels, blood glucose control and heart disease;Assessed and discussed foot care and prevention of foot problems;Relationship between chronic complications and blood glucose control;Nephropathy, what it is, prevention of, the use of ACE, ARB's and early detection of through urine microalbumia.    Psychosocial adjustment  Role of stress on diabetes      Individualized Goals (developed by patient)   Nutrition  General guidelines for healthy choices and portions discussed    Physical Activity  Exercise 5-7 days per week;30 minutes per day    Medications  take my medication as prescribed    Monitoring   test my blood glucose as discussed;test blood glucose pre and post meals as discussed    Reducing Risk  do foot checks daily;treat hypoglycemia with 15 grams of carbs if blood glucose less than 70mg /dL;examine blood glucose patterns    Health Coping  Not Applicable      Post-Education Assessment   Patient understands the diabetes disease and treatment process.  Demonstrates understanding / competency    Patient understands incorporating nutritional management into lifestyle.  Demonstrates understanding / competency    Patient undertands incorporating physical activity into lifestyle.  Demonstrates understanding / competency    Patient understands using medications safely.  Demonstrates understanding / competency    Patient understands monitoring blood glucose, interpreting and using results  Demonstrates understanding / competency    Patient understands prevention, detection, and treatment of acute complications.  Demonstrates understanding / competency    Patient understands prevention, detection, and treatment of chronic complications.  Demonstrates understanding / competency    Patient understands how to develop strategies to address psychosocial issues.  Demonstrates understanding / competency    Patient understands how to develop strategies to promote health/change  behavior.  Demonstrates understanding / competency      Outcomes   Program Status  Completed       Learning Objective:  Patient will have a greater understanding of diabetes self-management. Patient education plan is to attend individual and/or group sessions per assessed needs and concerns.   Plan:   Patient Instructions  Goals:  Follow Diabetes Meal Plan as instructed  Eat 3 meals and 2 snacks, every 3-5 hrs  Limit carbohydrate intake to 45-60 grams carbohydrate/meal  Limit carbohydrate intake to 15-30 grams carbohydrate/snack  Add lean protein foods to meals/snacks  Monitor glucose levels as instructed by your doctor  Aim for 30 mins of physical activity daily  Bring food record and glucose log to your next nutrition visit     Expected Outcomes:  Demonstrated  interest in learning. Expect positive outcomes  Education material provided: ADA Diabetes: Your Take Control Guide and Carbohydrate counting sheet  If problems or questions, patient to contact team via:  Phone and Email  Future DSME appointment: - Yearly

## 2017-08-18 DIAGNOSIS — M5136 Other intervertebral disc degeneration, lumbar region: Secondary | ICD-10-CM | POA: Diagnosis not present

## 2017-08-18 DIAGNOSIS — I739 Peripheral vascular disease, unspecified: Secondary | ICD-10-CM | POA: Diagnosis not present

## 2017-08-18 DIAGNOSIS — M5416 Radiculopathy, lumbar region: Secondary | ICD-10-CM | POA: Diagnosis not present

## 2017-08-18 DIAGNOSIS — M5126 Other intervertebral disc displacement, lumbar region: Secondary | ICD-10-CM | POA: Diagnosis not present

## 2017-08-18 DIAGNOSIS — G629 Polyneuropathy, unspecified: Secondary | ICD-10-CM | POA: Diagnosis not present

## 2017-09-05 DIAGNOSIS — G629 Polyneuropathy, unspecified: Secondary | ICD-10-CM | POA: Diagnosis not present

## 2017-09-05 DIAGNOSIS — I739 Peripheral vascular disease, unspecified: Secondary | ICD-10-CM | POA: Diagnosis not present

## 2017-09-05 DIAGNOSIS — M5136 Other intervertebral disc degeneration, lumbar region: Secondary | ICD-10-CM | POA: Diagnosis not present

## 2017-09-05 DIAGNOSIS — M5416 Radiculopathy, lumbar region: Secondary | ICD-10-CM | POA: Diagnosis not present

## 2017-09-05 DIAGNOSIS — E119 Type 2 diabetes mellitus without complications: Secondary | ICD-10-CM | POA: Diagnosis not present

## 2017-09-11 DIAGNOSIS — E1122 Type 2 diabetes mellitus with diabetic chronic kidney disease: Secondary | ICD-10-CM | POA: Diagnosis not present

## 2017-09-11 DIAGNOSIS — I129 Hypertensive chronic kidney disease with stage 1 through stage 4 chronic kidney disease, or unspecified chronic kidney disease: Secondary | ICD-10-CM | POA: Diagnosis not present

## 2017-09-11 DIAGNOSIS — E039 Hypothyroidism, unspecified: Secondary | ICD-10-CM | POA: Diagnosis not present

## 2017-09-11 DIAGNOSIS — N182 Chronic kidney disease, stage 2 (mild): Secondary | ICD-10-CM | POA: Diagnosis not present

## 2017-11-14 ENCOUNTER — Encounter: Payer: Medicare Other | Admitting: Family Medicine

## 2017-12-05 DIAGNOSIS — R42 Dizziness and giddiness: Secondary | ICD-10-CM | POA: Diagnosis not present

## 2017-12-05 DIAGNOSIS — Z951 Presence of aortocoronary bypass graft: Secondary | ICD-10-CM | POA: Diagnosis not present

## 2017-12-05 DIAGNOSIS — R0609 Other forms of dyspnea: Secondary | ICD-10-CM | POA: Diagnosis not present

## 2017-12-05 DIAGNOSIS — G4730 Sleep apnea, unspecified: Secondary | ICD-10-CM | POA: Diagnosis not present

## 2017-12-05 DIAGNOSIS — I480 Paroxysmal atrial fibrillation: Secondary | ICD-10-CM | POA: Diagnosis not present

## 2017-12-11 DIAGNOSIS — I480 Paroxysmal atrial fibrillation: Secondary | ICD-10-CM | POA: Diagnosis not present

## 2017-12-11 DIAGNOSIS — I119 Hypertensive heart disease without heart failure: Secondary | ICD-10-CM | POA: Diagnosis not present

## 2017-12-11 DIAGNOSIS — I10 Essential (primary) hypertension: Secondary | ICD-10-CM | POA: Diagnosis not present

## 2017-12-11 DIAGNOSIS — Z951 Presence of aortocoronary bypass graft: Secondary | ICD-10-CM | POA: Diagnosis not present

## 2017-12-11 DIAGNOSIS — R0609 Other forms of dyspnea: Secondary | ICD-10-CM | POA: Diagnosis not present

## 2017-12-11 DIAGNOSIS — R42 Dizziness and giddiness: Secondary | ICD-10-CM | POA: Diagnosis not present

## 2017-12-11 DIAGNOSIS — R55 Syncope and collapse: Secondary | ICD-10-CM | POA: Diagnosis not present

## 2017-12-12 DIAGNOSIS — R0609 Other forms of dyspnea: Secondary | ICD-10-CM | POA: Diagnosis not present

## 2017-12-12 DIAGNOSIS — Z951 Presence of aortocoronary bypass graft: Secondary | ICD-10-CM | POA: Diagnosis not present

## 2017-12-12 DIAGNOSIS — R55 Syncope and collapse: Secondary | ICD-10-CM | POA: Diagnosis not present

## 2017-12-12 DIAGNOSIS — G4730 Sleep apnea, unspecified: Secondary | ICD-10-CM | POA: Diagnosis not present

## 2017-12-12 DIAGNOSIS — I4891 Unspecified atrial fibrillation: Secondary | ICD-10-CM | POA: Diagnosis not present

## 2017-12-12 DIAGNOSIS — R42 Dizziness and giddiness: Secondary | ICD-10-CM | POA: Diagnosis not present

## 2017-12-18 DIAGNOSIS — I119 Hypertensive heart disease without heart failure: Secondary | ICD-10-CM | POA: Diagnosis not present

## 2017-12-18 DIAGNOSIS — R55 Syncope and collapse: Secondary | ICD-10-CM | POA: Diagnosis not present

## 2017-12-18 DIAGNOSIS — R42 Dizziness and giddiness: Secondary | ICD-10-CM | POA: Diagnosis not present

## 2017-12-18 DIAGNOSIS — R0609 Other forms of dyspnea: Secondary | ICD-10-CM | POA: Diagnosis not present

## 2017-12-18 DIAGNOSIS — Z951 Presence of aortocoronary bypass graft: Secondary | ICD-10-CM | POA: Diagnosis not present

## 2017-12-18 DIAGNOSIS — I480 Paroxysmal atrial fibrillation: Secondary | ICD-10-CM | POA: Diagnosis not present

## 2018-01-09 DIAGNOSIS — N183 Chronic kidney disease, stage 3 (moderate): Secondary | ICD-10-CM | POA: Diagnosis not present

## 2018-01-09 DIAGNOSIS — I5033 Acute on chronic diastolic (congestive) heart failure: Secondary | ICD-10-CM | POA: Diagnosis not present

## 2018-01-09 DIAGNOSIS — I130 Hypertensive heart and chronic kidney disease with heart failure and stage 1 through stage 4 chronic kidney disease, or unspecified chronic kidney disease: Secondary | ICD-10-CM | POA: Diagnosis not present

## 2018-01-09 DIAGNOSIS — E1122 Type 2 diabetes mellitus with diabetic chronic kidney disease: Secondary | ICD-10-CM | POA: Diagnosis not present

## 2018-01-09 DIAGNOSIS — I480 Paroxysmal atrial fibrillation: Secondary | ICD-10-CM | POA: Diagnosis not present

## 2018-01-14 DIAGNOSIS — J209 Acute bronchitis, unspecified: Secondary | ICD-10-CM | POA: Diagnosis not present

## 2018-01-14 DIAGNOSIS — I480 Paroxysmal atrial fibrillation: Secondary | ICD-10-CM | POA: Diagnosis not present

## 2018-01-14 DIAGNOSIS — Z0001 Encounter for general adult medical examination with abnormal findings: Secondary | ICD-10-CM | POA: Diagnosis not present

## 2018-01-14 DIAGNOSIS — I739 Peripheral vascular disease, unspecified: Secondary | ICD-10-CM | POA: Diagnosis not present

## 2018-01-14 DIAGNOSIS — I129 Hypertensive chronic kidney disease with stage 1 through stage 4 chronic kidney disease, or unspecified chronic kidney disease: Secondary | ICD-10-CM | POA: Diagnosis not present

## 2018-02-06 ENCOUNTER — Telehealth: Payer: Self-pay | Admitting: Family Medicine

## 2018-02-06 NOTE — Telephone Encounter (Signed)
Spoke with [name] regarding AWV. Patient stated that he is no longer a patient of Dr. Carmelia Roller. Informed patient that information will be updated in the system. SF

## 2018-03-13 DIAGNOSIS — I480 Paroxysmal atrial fibrillation: Secondary | ICD-10-CM | POA: Diagnosis not present

## 2018-03-13 DIAGNOSIS — D631 Anemia in chronic kidney disease: Secondary | ICD-10-CM | POA: Diagnosis not present

## 2018-03-13 DIAGNOSIS — N183 Chronic kidney disease, stage 3 (moderate): Secondary | ICD-10-CM | POA: Diagnosis not present

## 2018-03-13 DIAGNOSIS — I5033 Acute on chronic diastolic (congestive) heart failure: Secondary | ICD-10-CM | POA: Diagnosis not present

## 2018-03-13 DIAGNOSIS — E790 Hyperuricemia without signs of inflammatory arthritis and tophaceous disease: Secondary | ICD-10-CM | POA: Diagnosis not present

## 2018-03-13 DIAGNOSIS — R801 Persistent proteinuria, unspecified: Secondary | ICD-10-CM | POA: Diagnosis not present

## 2018-03-13 DIAGNOSIS — Z951 Presence of aortocoronary bypass graft: Secondary | ICD-10-CM | POA: Diagnosis not present

## 2018-03-13 DIAGNOSIS — I129 Hypertensive chronic kidney disease with stage 1 through stage 4 chronic kidney disease, or unspecified chronic kidney disease: Secondary | ICD-10-CM | POA: Diagnosis not present

## 2018-03-13 DIAGNOSIS — R0609 Other forms of dyspnea: Secondary | ICD-10-CM | POA: Diagnosis not present

## 2018-03-14 DIAGNOSIS — M7989 Other specified soft tissue disorders: Secondary | ICD-10-CM | POA: Diagnosis not present

## 2018-03-14 DIAGNOSIS — M25562 Pain in left knee: Secondary | ICD-10-CM | POA: Diagnosis not present

## 2018-03-16 DIAGNOSIS — M10362 Gout due to renal impairment, left knee: Secondary | ICD-10-CM | POA: Diagnosis not present

## 2018-03-16 DIAGNOSIS — N183 Chronic kidney disease, stage 3 (moderate): Secondary | ICD-10-CM | POA: Diagnosis not present

## 2018-03-17 ENCOUNTER — Ambulatory Visit: Payer: Medicare Other | Admitting: Podiatry

## 2018-03-17 ENCOUNTER — Encounter: Payer: Self-pay | Admitting: Podiatry

## 2018-03-17 DIAGNOSIS — E119 Type 2 diabetes mellitus without complications: Secondary | ICD-10-CM

## 2018-03-17 NOTE — Progress Notes (Signed)
This patient presents to the office for an evaluation of his newly diagnosed DM.    This patient  says there  is  no pain and discomfort in his  feet.  This patient says he experienced a gout attack in his left knee this past Saturday.  .  Patient has no history of infection or drainage from both feet.  . This patient presents  to the office today for a  foot evaluation due to history of  Diabetes. Patient has kidney disease and is taking eliquiss.  General Appearance  Alert, conversant and in no acute stress.  Vascular  Dorsalis pedis and posterior tibial  pulses are palpable  bilaterally.  Capillary return is within normal limits  bilaterally. Temperature is within normal limits  bilaterally.  Neurologic  Senn-Weinstein monofilament wire test within normal limits  bilaterally. Muscle power within normal limits bilaterally.  Nails Normal nails noted with no evidence of bacterial or fungal infection.  Orthopedic  No limitations of motion of motion feet .  No crepitus or effusions noted.  No bony pathology or digital deformities noted.  Skin  normotropic skin with no porokeratosis noted bilaterally.  No signs of infections or ulcers noted.     Onychomycosis  Diabetes with no foot complications  IE   A diabetic foot exam was performed and there is no evidence of any vascular or neurologic pathology.   RTC 1 year for annual diabetic foot exam..   Helane Gunther DPM

## 2018-03-24 DIAGNOSIS — E1122 Type 2 diabetes mellitus with diabetic chronic kidney disease: Secondary | ICD-10-CM | POA: Diagnosis not present

## 2018-04-03 DIAGNOSIS — I480 Paroxysmal atrial fibrillation: Secondary | ICD-10-CM | POA: Diagnosis not present

## 2018-04-03 DIAGNOSIS — Z9889 Other specified postprocedural states: Secondary | ICD-10-CM | POA: Diagnosis not present

## 2018-04-03 DIAGNOSIS — Z951 Presence of aortocoronary bypass graft: Secondary | ICD-10-CM | POA: Diagnosis not present

## 2018-04-03 DIAGNOSIS — I119 Hypertensive heart disease without heart failure: Secondary | ICD-10-CM | POA: Diagnosis not present

## 2018-04-03 DIAGNOSIS — I739 Peripheral vascular disease, unspecified: Secondary | ICD-10-CM | POA: Diagnosis not present

## 2018-04-07 DIAGNOSIS — H60502 Unspecified acute noninfective otitis externa, left ear: Secondary | ICD-10-CM | POA: Diagnosis not present

## 2018-04-07 DIAGNOSIS — N183 Chronic kidney disease, stage 3 (moderate): Secondary | ICD-10-CM | POA: Diagnosis not present

## 2018-04-18 ENCOUNTER — Emergency Department (HOSPITAL_BASED_OUTPATIENT_CLINIC_OR_DEPARTMENT_OTHER)
Admission: EM | Admit: 2018-04-18 | Discharge: 2018-04-18 | Disposition: A | Discharge status: Home / Self Care | Payer: Medicare Other | Attending: Emergency Medicine | Admitting: Emergency Medicine

## 2018-04-18 ENCOUNTER — Other Ambulatory Visit: Payer: Self-pay

## 2018-04-18 ENCOUNTER — Emergency Department (HOSPITAL_BASED_OUTPATIENT_CLINIC_OR_DEPARTMENT_OTHER): Payer: Medicare Other

## 2018-04-18 ENCOUNTER — Encounter (HOSPITAL_BASED_OUTPATIENT_CLINIC_OR_DEPARTMENT_OTHER): Payer: Self-pay

## 2018-04-18 DIAGNOSIS — Z951 Presence of aortocoronary bypass graft: Secondary | ICD-10-CM | POA: Insufficient documentation

## 2018-04-18 DIAGNOSIS — Z79899 Other long term (current) drug therapy: Secondary | ICD-10-CM | POA: Diagnosis not present

## 2018-04-18 DIAGNOSIS — M25562 Pain in left knee: Secondary | ICD-10-CM | POA: Diagnosis not present

## 2018-04-18 DIAGNOSIS — Y929 Unspecified place or not applicable: Secondary | ICD-10-CM | POA: Insufficient documentation

## 2018-04-18 DIAGNOSIS — Z7901 Long term (current) use of anticoagulants: Secondary | ICD-10-CM | POA: Diagnosis not present

## 2018-04-18 DIAGNOSIS — Y939 Activity, unspecified: Secondary | ICD-10-CM | POA: Diagnosis not present

## 2018-04-18 DIAGNOSIS — I2510 Atherosclerotic heart disease of native coronary artery without angina pectoris: Secondary | ICD-10-CM | POA: Insufficient documentation

## 2018-04-18 DIAGNOSIS — Z87891 Personal history of nicotine dependence: Secondary | ICD-10-CM | POA: Diagnosis not present

## 2018-04-18 DIAGNOSIS — S99912A Unspecified injury of left ankle, initial encounter: Secondary | ICD-10-CM | POA: Diagnosis not present

## 2018-04-18 DIAGNOSIS — I10 Essential (primary) hypertension: Secondary | ICD-10-CM | POA: Diagnosis not present

## 2018-04-18 DIAGNOSIS — E039 Hypothyroidism, unspecified: Secondary | ICD-10-CM | POA: Insufficient documentation

## 2018-04-18 DIAGNOSIS — Z7982 Long term (current) use of aspirin: Secondary | ICD-10-CM | POA: Diagnosis not present

## 2018-04-18 DIAGNOSIS — W19XXXA Unspecified fall, initial encounter: Secondary | ICD-10-CM | POA: Diagnosis not present

## 2018-04-18 DIAGNOSIS — Z794 Long term (current) use of insulin: Secondary | ICD-10-CM | POA: Diagnosis not present

## 2018-04-18 DIAGNOSIS — Y999 Unspecified external cause status: Secondary | ICD-10-CM | POA: Diagnosis not present

## 2018-04-18 DIAGNOSIS — E119 Type 2 diabetes mellitus without complications: Secondary | ICD-10-CM | POA: Diagnosis not present

## 2018-04-18 DIAGNOSIS — M25572 Pain in left ankle and joints of left foot: Secondary | ICD-10-CM | POA: Diagnosis not present

## 2018-04-18 MED ORDER — ACETAMINOPHEN 500 MG PO TABS
1000.0000 mg | ORAL_TABLET | Freq: Once | ORAL | Status: AC
  Administered 2018-04-18: 1000 mg via ORAL
  Filled 2018-04-18: qty 2

## 2018-04-18 NOTE — ED Triage Notes (Signed)
Pt reports recent gout flare-up to left knee. Reports fall last Saturday, injuring left ankle. Also sts he drove home from [place] yesterday as well.

## 2018-04-18 NOTE — Discharge Instructions (Addendum)
It was our pleasure to provide your ER care today - we hope that you feel better.  Wear ankle brace for comfort/support. Elevate, ice. Take acetaminophen as need for pain.   Follow up with primary care doctor in the next 1-2 weeks. Also have blood pressure rechecked then, as it is high today.  Return to ER if worse, new symptoms, severe leg swelling, worsening/severe pain, other concern.

## 2018-04-18 NOTE — ED Notes (Signed)
Pt was given Keflex and Colchicine last month at [facility] during that gout flare up.

## 2018-04-18 NOTE — ED Provider Notes (Addendum)
[facility] EMERGENCY DEPARTMENT Provider Note   CSN: [id number] Arrival date & time: 04/18/18  1038    History   Chief Complaint Chief Complaint  Patient presents with  . Leg Pain    HPI BAKER MORONTA is a 73 y.o. male.     Patient c/o left knee and left ankle pain. States left knee pain is anterior left knee, and present for past 1+ month - indicates was seen at [facility] and had xrays, was told arthritis/gout. Pt indicates a week ago had a fall onto left ankle, and c/o left ankle pain since. Pain moderate, constant, worse w palpation and weight bearing. Indicates is able to walk, but painful. Denies leg or foot numbness or weakness. Skin intact. No hip pain. w recent fall, denies head injury or loc. No headache. No neck or back pain. Denies other pain or injury. No new leg swelling. No calf pain. No chest pain or sob.   The history is provided by the patient.  Leg Pain  Associated symptoms: no back pain, no fever and no neck pain     Past Medical History:  Diagnosis Date  . CAD (coronary artery disease) 2006   X 5. Low risk Nuc 9/13  . Chronic anticoagulation    Eliquis  . Dyslipidemia   . Hypertension   . Hypothyroid   . Insulin dependent diabetes mellitus (HCC)   . NSVT (nonsustained ventricular tachycardia) (HCC) 3/14   8 bts-NL LVF on echo  . Obesity   . PAF (paroxysmal atrial fibrillation) (HCC) 3/14   3 hrs on Holter March 2014. Pt declined Coumadin  . PVD (peripheral vascular disease) (HCC) 5/06   Lt renal artery stent- dopplers OK 9/13  . S/P CABG (coronary artery bypass graft) 2006  . Sleep apnea    compliant with C-pap    Patient Active Problem List   Diagnosis Date Noted  . Essential hypertension 04/24/2016  . Medication reaction 12/02/2012  . CAD - CABG X 5 '06. Low risk Nuc 9/13   . Sleep apnea- on C-pap   . Obesity   . Dyslipidemia   . PAF on Holter 3/14   . NSVT - 8 bts 3/14- NL LVF by echo   . Insulin dependent diabetes mellitus  (HCC)   . PVD - Lt RAS s/p stent 5/06. Dopplers OK 9/13   . Hypothyroid   . Chronic anticoagulation     Past Surgical History:  Procedure Laterality Date  . CARDIAC CATHETERIZATION  03/05/2004   normal L main, LA 80% stenosis in mid region, diagonal w/ 90% osital stenosis, ramus intermedius w/ 80% ostial stenosis, L Cfx w/ 80% stenosis in distal AV groove, RCA with 60% prox/90% mid stenosis - subsequent CABG (Dr. Erlene Quan)  . CORONARY ARTERY BYPASS GRAFT  03/14/2004   LIMA to LAD, SVG to distal LAD, Left Radial to Ramus Intermedius, SVG to Diagonal, SVG to PDA (Dr. Kathie Rhodes. Hendrickson)  . NM MYOCAR PERF WALL MOTION  10/2011   lexiscan - no evidence of inducible ischemia, normal/low risk scan  . RENAL ARTERY STENT Left 06/04/2004   6x18 [employer] on Aviator balloon stent to left renal artery (Dr. Erlene Quan)  . RENAL DOPPLER  10/2011   right renal artery - patent; left renal artery - patent  . TRANSTHORACIC ECHOCARDIOGRAM  04/2012   EF 55-60%, mild LVH, mild conc hypertrophy, grade 2 diastolic dysfunction; mildly calcified MV annulus; LA mod dilated; RVSP increased; mild TR; PA peak pressure  Home Medications    Prior to Admission medications   Medication Sig Start Date End Date Taking? Authorizing Provider  allopurinol (ZYLOPRIM) 300 MG tablet Take by mouth. 02/06/18   [provider]  amLODipine (NORVASC) 5 MG tablet Take by mouth. 12/16/17   [provider]  apixaban (ELIQUIS) 5 MG TABS tablet Take 1 tablet (5 mg total) by mouth 2 (two) times daily. 08/15/16   Runell Gess, MD  aspirin 81 MG tablet Take 81 mg by mouth daily.    [provider]  atenolol (TENORMIN) 25 MG tablet Take 0.5 tablets (12.5 mg total) by mouth daily. 06/13/16   Runell Gess, MD  atorvastatin (LIPITOR) 10 MG tablet Take 10 mg by mouth daily.    [provider]  betamethasone dipropionate (DIPROLENE) 0.05 % cream Apply topically as needed.    [provider]   Cholecalciferol (VITAMIN D-1000 MAX ST) 25 MCG (1000 UT) tablet Take by mouth.    [provider]  clotrimazole (LOTRIMIN) 1 % cream Apply topically. 01/20/18   [provider]  colchicine 0.6 MG tablet Take by mouth. 03/14/18 03/21/18  [provider]  empagliflozin (JARDIANCE) 25 MG TABS tablet Take 25 mg by mouth daily.    [provider]  fish oil-omega-3 fatty acids 1000 MG capsule Take 4 g daily by mouth.     [provider]  fluticasone (FLONASE) 50 MCG/ACT nasal spray Place 2 sprays into the nose.  11/19/12   [provider]  furosemide (LASIX) 40 MG tablet Take 40 mg by mouth daily.    [provider]  insulin glargine (LANTUS) 100 UNIT/ML injection Inject into the skin.    [provider]  insulin lispro (HUMALOG KWIKPEN) 100 UNIT/ML KwikPen  10/03/17   [provider]  irbesartan (AVAPRO) 300 MG tablet Take 300 mg by mouth. 10/08/12   [provider]  levothyroxine (SYNTHROID, LEVOTHROID) 50 MCG tablet  09/15/12   [provider]  losartan (COZAAR) 100 MG tablet Take by mouth. 12/22/17   [provider]  magnesium oxide (MAG-OX) 400 MG tablet Take by mouth. 02/06/18   [provider]  metolazone (ZAROXOLYN) 5 MG tablet Take by mouth. 01/09/18 04/09/18  [provider]  Misc Natural Products (PROSTATE HEALTH PO) Take 1 tablet by mouth daily.    [provider]  Multiple Vitamin (MULTIVITAMIN) capsule Take 1 capsule by mouth daily.    [provider]  nitroGLYCERIN (NITROSTAT) 0.4 MG SL tablet DISSOLVE 1 TABLET UNDER TONGUE EVERY 5 MINUTES UP TO 3 DOSES 03/06/16   Runell Gess, MD  NON FORMULARY OTC supplement with co Q 10. Tru Heart 2 tabs daily    [provider]  omeprazole (PRILOSEC) 20 MG capsule Take by mouth.    [provider]  potassium chloride SA (K-DUR,KLOR-CON) 20 MEQ tablet TAKE 1 TABLET BY MOUTH  DAILY 07/08/17   Runell Gess, MD  terazosin (HYTRIN) 5 MG capsule Take by mouth. 02/16/18 05/17/18  [provider]  torsemide (DEMADEX) 100 MG tablet Take by mouth.    [provider]  UNABLE TO FIND Take by mouth.    [provider]    Family History Family History  Problem Relation Age of Onset  . Cancer Mother 61    Social History Social History   Tobacco Use  . Smoking status: Former Smoker    Last attempt to quit: 12/02/1988    Years since quitting: 29.3  .  Smokeless tobacco: Never Used  Substance Use Topics  . Alcohol use: Not Currently  . Drug use: Never     Allergies   Metformin and related and Metoprolol   Review of Systems Review of Systems  Constitutional: Negative for chills and fever.  HENT: Negative for sore throat.   Eyes: Negative for redness.  Respiratory: Negative for shortness of breath.   Cardiovascular: Negative for chest pain, palpitations and leg swelling.  Gastrointestinal: Negative for abdominal pain, nausea and vomiting.  Genitourinary: Negative for flank pain.  Musculoskeletal: Negative for back pain and neck pain.  Skin: Negative for rash.  Neurological: Negative for numbness and headaches.  Hematological: Does not bruise/bleed easily.  Psychiatric/Behavioral: Negative for confusion.     Physical Exam Updated Vital Signs BP (!) 160/69 (BP Location: Right Arm)   Pulse 60   Temp 98.4 F (36.9 C) (Oral)   Resp 18   Ht 1.727 m (5\' 8" )   Wt 113.4 kg   SpO2 99%   BMI 38.01 kg/m   Physical Exam Vitals signs and nursing note reviewed.  Constitutional:      Appearance: Normal appearance. He is well-developed.  HENT:     Head: Atraumatic.     Nose: Nose normal.     Mouth/Throat:     Mouth: Mucous membranes are moist.  Eyes:     General: No scleral icterus.    Conjunctiva/sclera: Conjunctivae normal.  Neck:     Musculoskeletal: Normal range of motion and neck supple. No neck rigidity.     Trachea: No tracheal  deviation.  Cardiovascular:     Rate and Rhythm: Normal rate.     Pulses: Normal pulses.  Pulmonary:     Effort: Pulmonary effort is normal. No accessory muscle usage or respiratory distress.  Abdominal:     General: There is no distension.  Genitourinary:    Comments: No cva tenderness. Musculoskeletal:        General: No swelling.     Comments: Left knee with mild tenderness anteriorly/laterally. No effusion. No erythema. Left ankle with moderate sts, and tenderness lat and post. Ankle and knee are grossly stable. Distal pulses palp. No calf swelling or pain. Good passive rom at left hip, knee and ankle without pain. No skin changes, lesions, or rash. Normal color and warmth to LLE.   Skin:    General: Skin is warm and dry.     Findings: No rash.  Neurological:     Mental Status: He is alert.     Comments: Alert, speech clear.   Psychiatric:        Mood and Affect: Mood normal.      ED Treatments / Results  Labs (all labs ordered are listed, but only abnormal results are displayed) Labs Reviewed - No data to display  EKG None  Radiology Dg Ankle Complete Left  Result Date: 04/18/2018 CLINICAL DATA:  Larey Seat 1 week ago. Persistent left ankle pain and swelling. EXAM: LEFT ANKLE COMPLETE - 3+ VIEW COMPARISON:  None. FINDINGS: There is marked diffuse soft tissue swelling noted around the ankle distal lower extremity. The ankle mortise is maintained. No acute ankle fracture is identified. Moderate calcifications at the Achilles tendon attachment site. Extensive arterial calcifications. IMPRESSION: No acute fracture. Electronically Signed   By: Rudie Meyer M.D.   On: 04/18/2018 12:20    Procedures Procedures (including critical care time)  Medications Ordered in ED Medications - No data to display   Initial Impression / Assessment and Plan /  ED Course  I have reviewed the triage vital signs and the nursing notes.  Pertinent labs & imaging results that were available during  my care of the patient were reviewed by me and considered in my medical decision making (see chart for details).  Reviewed nursing notes and prior charts for additional history.  xrays from prior month of left knee noted, pain persistent/unchanged since. Will get imaging of left ankle/new injury.   xrays reviewed - no fx. ?tendonitis. Pt does note pain posteriorly in ankle. Achilles is grossly intact, but tender.   Discussed xrays w pt.   ASO support. Ortho f/u.   Hx mild renal insuff, therefore will rec acetaminophen prn.   Final Clinical Impressions(s) / ED Diagnoses   Final diagnoses:  None    ED Discharge Orders    None           Cathren Laine, MD 04/18/18 1309

## 2018-04-18 NOTE — ED Notes (Signed)
Left ankle is slightly red

## 2018-04-21 DIAGNOSIS — N183 Chronic kidney disease, stage 3 (moderate): Secondary | ICD-10-CM | POA: Diagnosis not present

## 2018-04-21 DIAGNOSIS — Z794 Long term (current) use of insulin: Secondary | ICD-10-CM | POA: Diagnosis not present

## 2018-04-21 DIAGNOSIS — M10362 Gout due to renal impairment, left knee: Secondary | ICD-10-CM | POA: Diagnosis not present

## 2018-04-21 DIAGNOSIS — R600 Localized edema: Secondary | ICD-10-CM | POA: Diagnosis not present

## 2018-04-21 DIAGNOSIS — E119 Type 2 diabetes mellitus without complications: Secondary | ICD-10-CM | POA: Diagnosis not present

## 2018-05-19 DIAGNOSIS — M25521 Pain in right elbow: Secondary | ICD-10-CM | POA: Diagnosis not present

## 2018-05-19 DIAGNOSIS — M109 Gout, unspecified: Secondary | ICD-10-CM | POA: Diagnosis not present

## 2018-05-20 ENCOUNTER — Encounter: Payer: Self-pay | Admitting: Family Medicine

## 2018-05-26 DIAGNOSIS — Z9689 Presence of other specified functional implants: Secondary | ICD-10-CM | POA: Diagnosis not present

## 2018-05-26 DIAGNOSIS — N320 Bladder-neck obstruction: Secondary | ICD-10-CM | POA: Diagnosis not present

## 2018-05-26 DIAGNOSIS — Z794 Long term (current) use of insulin: Secondary | ICD-10-CM | POA: Diagnosis not present

## 2018-05-26 DIAGNOSIS — N183 Chronic kidney disease, stage 3 (moderate): Secondary | ICD-10-CM | POA: Diagnosis not present

## 2018-05-26 DIAGNOSIS — I2510 Atherosclerotic heart disease of native coronary artery without angina pectoris: Secondary | ICD-10-CM | POA: Diagnosis not present

## 2018-05-26 DIAGNOSIS — I129 Hypertensive chronic kidney disease with stage 1 through stage 4 chronic kidney disease, or unspecified chronic kidney disease: Secondary | ICD-10-CM | POA: Diagnosis not present

## 2018-05-26 DIAGNOSIS — E039 Hypothyroidism, unspecified: Secondary | ICD-10-CM | POA: Diagnosis not present

## 2018-05-26 DIAGNOSIS — E1122 Type 2 diabetes mellitus with diabetic chronic kidney disease: Secondary | ICD-10-CM | POA: Diagnosis not present

## 2018-05-26 DIAGNOSIS — I701 Atherosclerosis of renal artery: Secondary | ICD-10-CM | POA: Diagnosis not present

## 2018-05-26 DIAGNOSIS — E790 Hyperuricemia without signs of inflammatory arthritis and tophaceous disease: Secondary | ICD-10-CM | POA: Diagnosis not present

## 2018-05-26 DIAGNOSIS — I472 Ventricular tachycardia: Secondary | ICD-10-CM | POA: Diagnosis not present

## 2018-05-26 DIAGNOSIS — Z7901 Long term (current) use of anticoagulants: Secondary | ICD-10-CM | POA: Diagnosis not present

## 2018-05-26 DIAGNOSIS — I4891 Unspecified atrial fibrillation: Secondary | ICD-10-CM | POA: Diagnosis not present

## 2018-05-26 DIAGNOSIS — E11319 Type 2 diabetes mellitus with unspecified diabetic retinopathy without macular edema: Secondary | ICD-10-CM | POA: Diagnosis not present

## 2018-05-26 DIAGNOSIS — E1140 Type 2 diabetes mellitus with diabetic neuropathy, unspecified: Secondary | ICD-10-CM | POA: Diagnosis not present

## 2018-05-26 DIAGNOSIS — Z79899 Other long term (current) drug therapy: Secondary | ICD-10-CM | POA: Diagnosis not present

## 2018-05-29 DIAGNOSIS — M10021 Idiopathic gout, right elbow: Secondary | ICD-10-CM | POA: Diagnosis not present

## 2018-06-10 ENCOUNTER — Telehealth: Payer: Self-pay | Admitting: *Deleted

## 2018-06-10 NOTE — Telephone Encounter (Signed)
Recall removed, John Conley,stated he no longer see's Dr.Berry.

## 2019-03-16 ENCOUNTER — Ambulatory Visit: Payer: Self-pay | Admitting: Podiatry

## 2020-11-27 IMAGING — DX LEFT ANKLE COMPLETE - 3+ VIEW
3 series · 3 of 3 positions shown · non-contrast
Comparison: None.

CLINICAL DATA: Fell 1 week ago. Persistent left ankle pain and
swelling.

EXAM:
LEFT ANKLE COMPLETE - 3+ VIEW

[ankle ap]
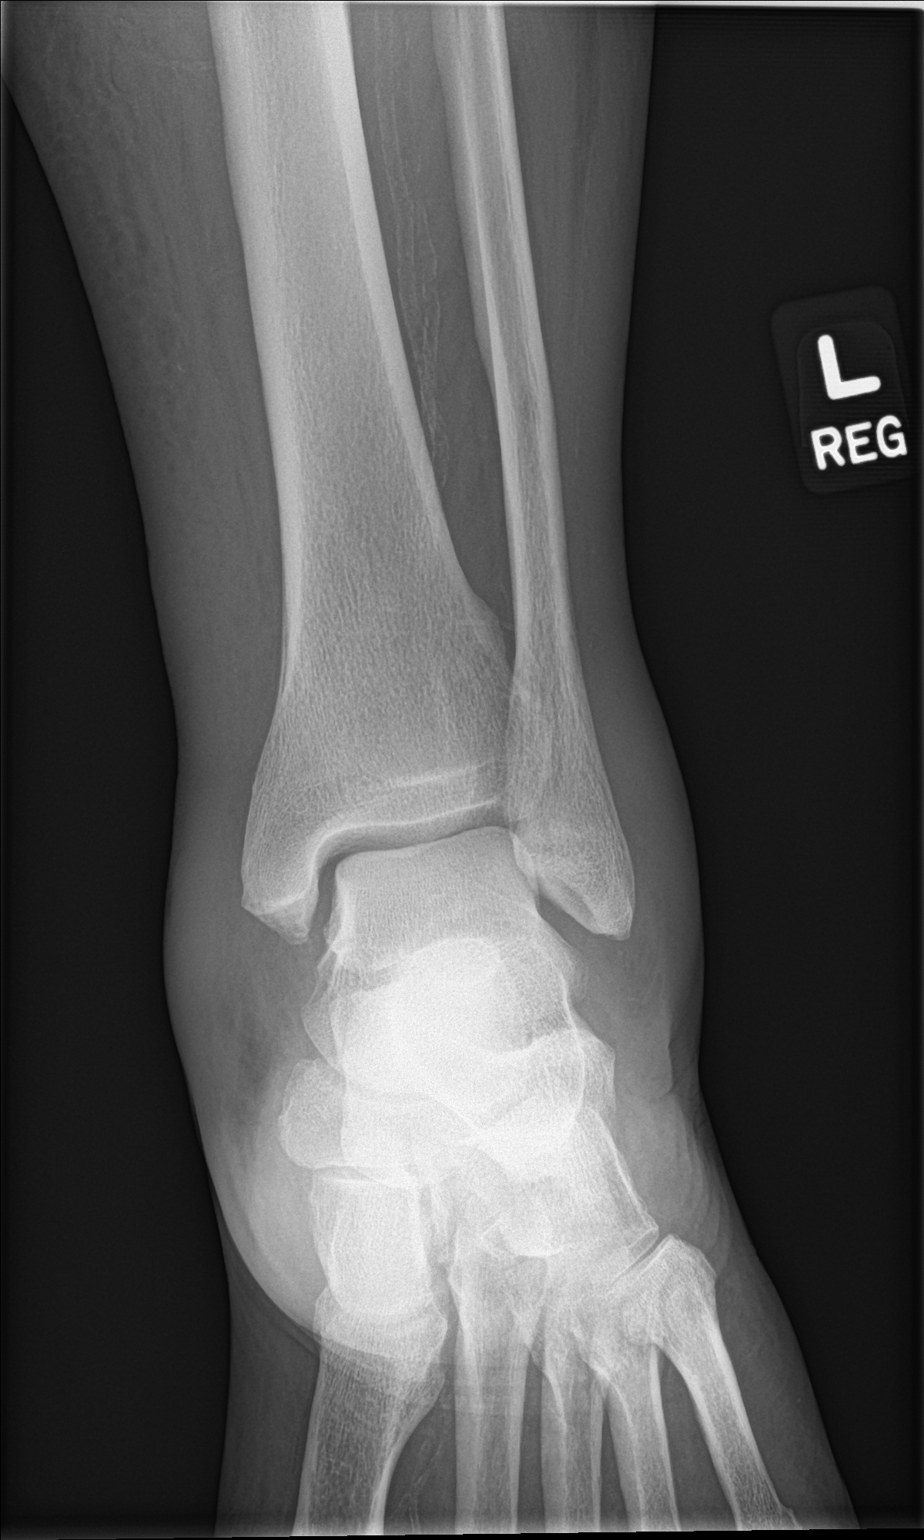

[ankle obl]
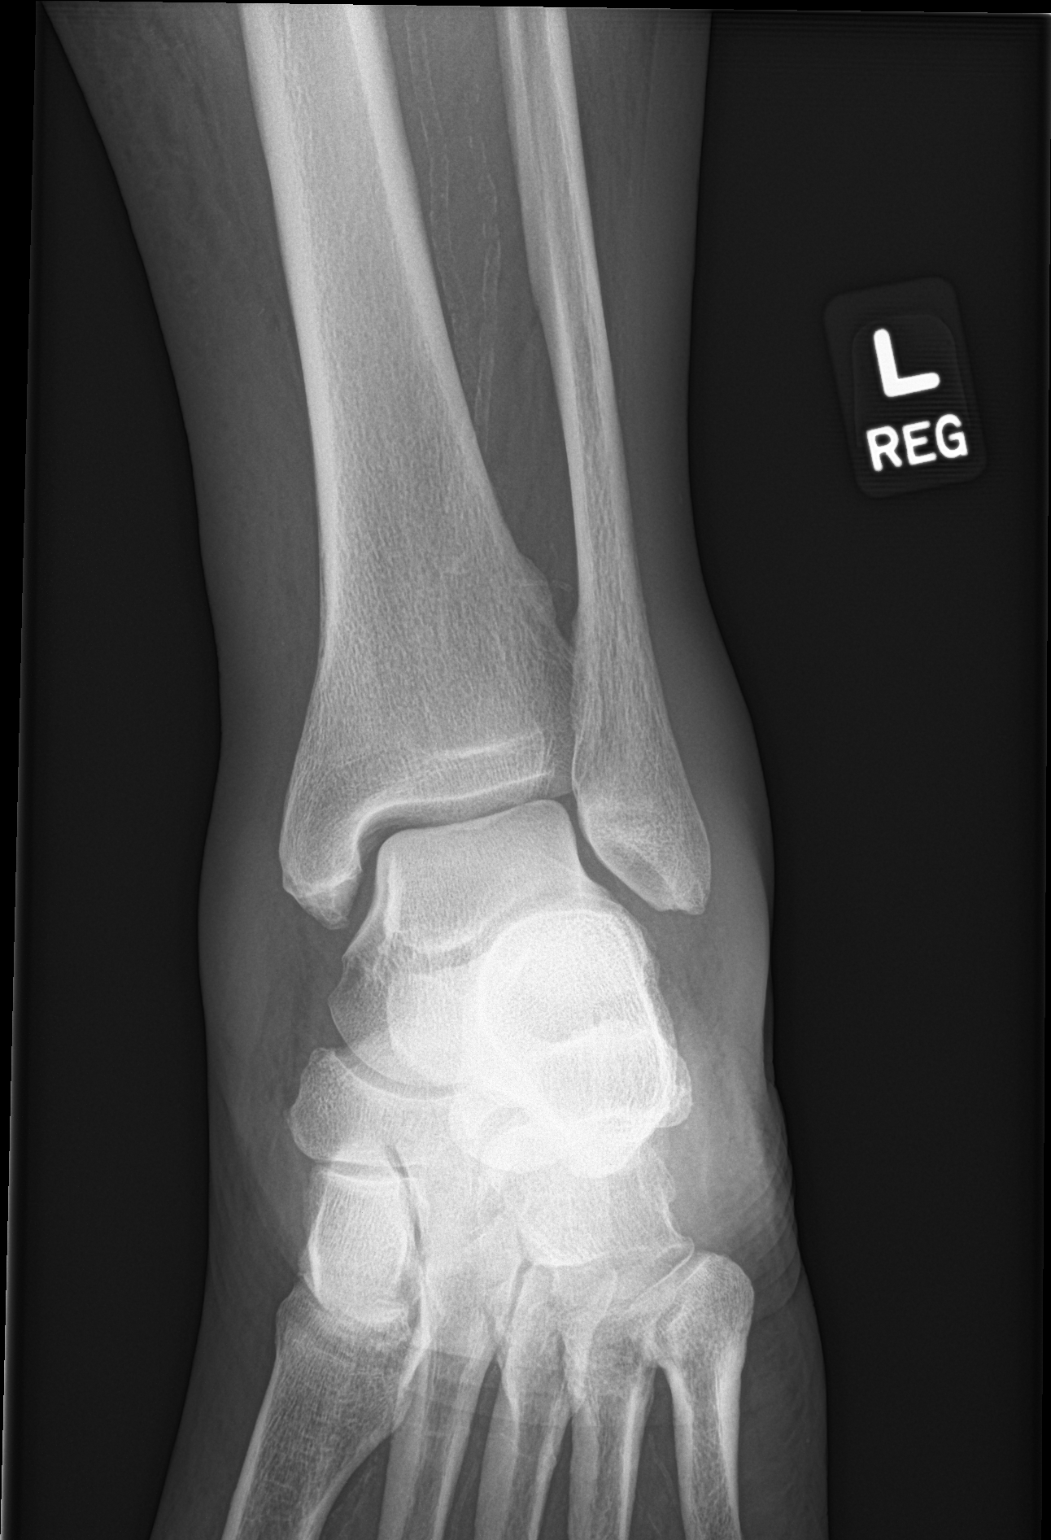

[ankle lat]
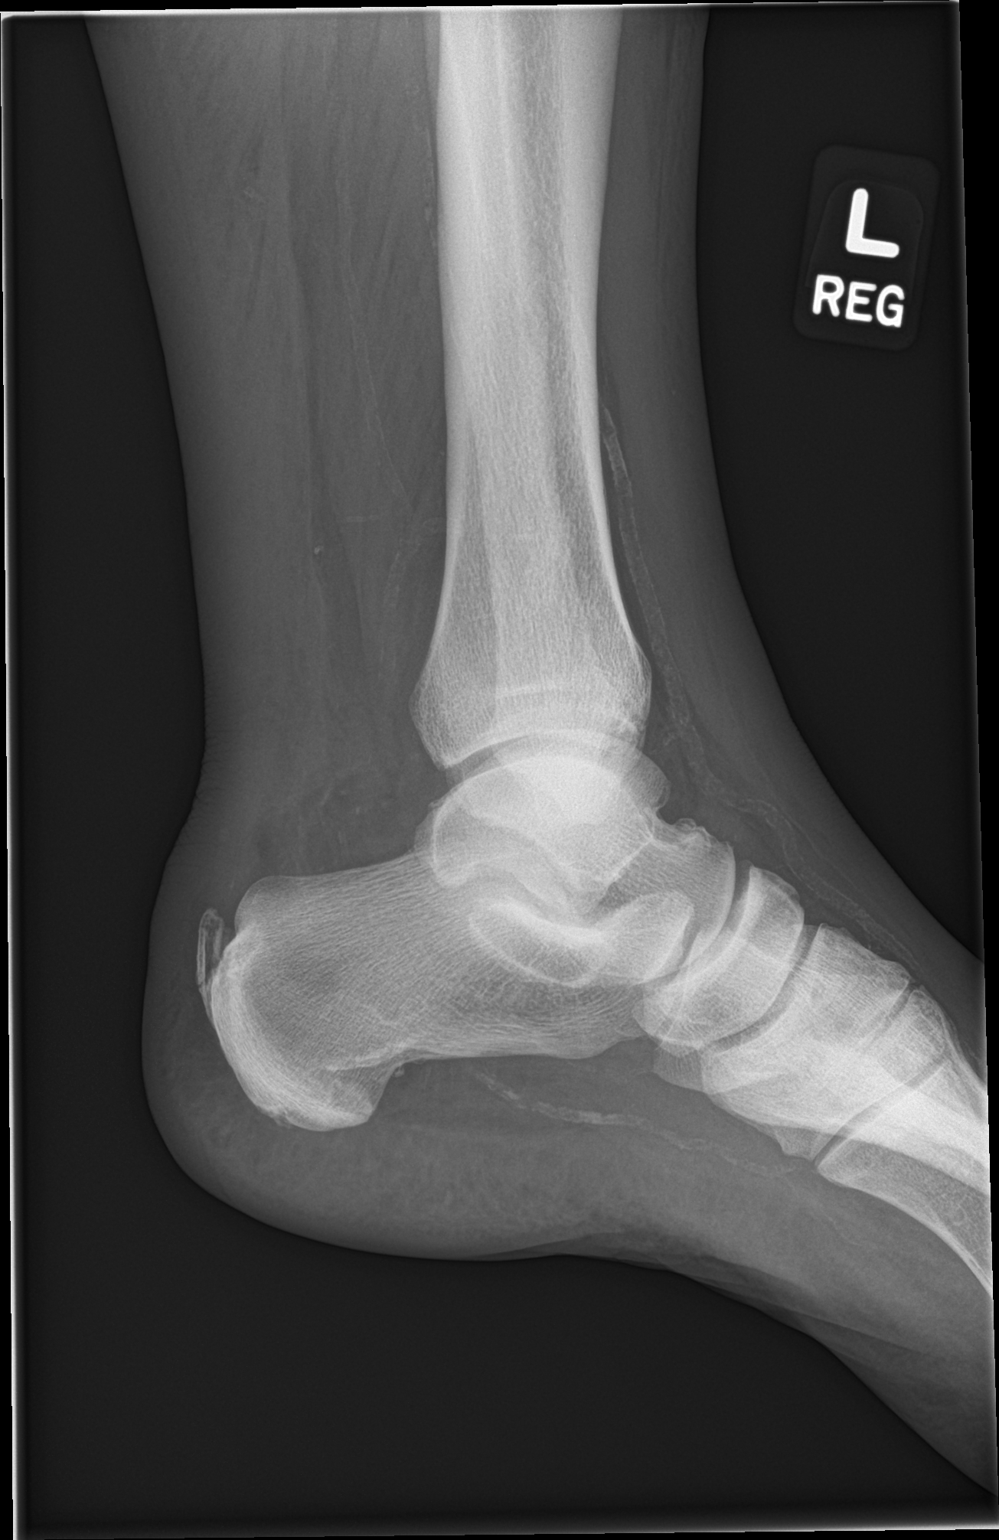

[3 of 3 positions shown; findings below may reference images not displayed]

FINDINGS: There is marked diffuse soft tissue swelling noted around the ankle
distal lower extremity. The ankle mortise is maintained. No acute
ankle fracture is identified.

Moderate calcifications at the Achilles tendon attachment site.

Extensive arterial calcifications.
IMPRESSION: No acute fracture.
